# Patient Record
Sex: Male | Born: 1970 | Race: Asian | Marital: Married | Smoking: Never smoker
Health system: Southern US, Community
[De-identification: ages and names within clinical notes are randomized; demographics above are authoritative.]

## PROBLEM LIST (undated history)

## (undated) DIAGNOSIS — E785 Hyperlipidemia, unspecified: Secondary | ICD-10-CM

## (undated) DIAGNOSIS — I498 Other specified cardiac arrhythmias: Secondary | ICD-10-CM

## (undated) DIAGNOSIS — I471 Supraventricular tachycardia, unspecified: Secondary | ICD-10-CM

## (undated) HISTORY — DX: Other specified cardiac arrhythmias: I49.8

---

## 2006-08-25 HISTORY — PX: CARDIAC DEFIBRILLATOR PLACEMENT: SHX171

## 2012-01-24 ENCOUNTER — Institutional Professional Consult (permissible substitution): Payer: Self-pay | Admitting: Cardiovascular Disease

## 2012-01-26 ENCOUNTER — Encounter: Payer: Self-pay | Admitting: Internal Medicine

## 2012-01-26 ENCOUNTER — Ambulatory Visit (INDEPENDENT_AMBULATORY_CARE_PROVIDER_SITE_OTHER): Payer: Managed Care, Other (non HMO) | Admitting: Internal Medicine

## 2012-01-26 VITALS — BP 125/84 | HR 65 | Ht 68.0 in | Wt 185.0 lb

## 2012-01-26 DIAGNOSIS — Q248 Other specified congenital malformations of heart: Secondary | ICD-10-CM

## 2012-01-26 DIAGNOSIS — I498 Other specified cardiac arrhythmias: Secondary | ICD-10-CM | POA: Insufficient documentation

## 2012-01-26 NOTE — Assessment & Plan Note (Signed)
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 Carelink very 3 months Return in 1 year

## 2012-01-26 NOTE — Progress Notes (Signed)
    Matthew Chan is a 41 y.o. male with a h/o brugada syndrome s/p (MDT) ICD implant in [place] in 2008  who presents today to establish care in the Electrophysiology device clinic.   He reports having several episodes of syncope in 2008 in the setting of an abnormal ekg.  Per patient, he was felt to have brugada syndrome and therefore had an ICD implanted.   The patient reports doing very well since having his device implanted and remains very active.  He denies any further syncope or ICD therapies delivered.  He travels frequently and has moved to [place] with his job.   Today, he  denies symptoms of palpitations, chest pain, shortness of breath, orthopnea, PND, lower extremity edema, dizziness, presyncope, syncope, or neurologic sequela.  The patientis tolerating medications without difficulties and is otherwise without complaint today.   Past Medical History  Diagnosis Date  . Brugada syndrome    Past Surgical History  Procedure Date  . Cardiac defibrillator placement 08-25-2006    medtronic, Maximo VR Defibrillator implanted by Dr Ernestine Conrad in [place]    History   Social History  . Marital Status: Married    Spouse Name: N/A    Number of Children: N/A  . Years of Education: N/A   Occupational History  . Not on file.   Social History Main Topics  . Smoking status: Never Smoker   . Smokeless tobacco: Not on file  . Alcohol Use: Yes     Comment: occasionally   . Drug Use: No  . Sexually Active: Not on file   Other Topics Concern  . Not on file   Social History Narrative   Arts administrator.  Lives in [place], moved here from [place]    Family History  Problem Relation Age of Onset  . Hypertension    . Diabetes    Denies FH of arrhythmias, sudden death, or Brugada  No Known Allergies  No current outpatient prescriptions on file.    ROS- all systems are reviewed and negative except as per HPI  Physical Exam: Filed Vitals:   01/26/12 1437  BP: 125/84  Pulse: 65  Height: 5\' 8"  (1.727 m)  Weight: 185 lb (83.915 kg)    GEN- The patient is well appearing, alert and oriented x 3 today.   Head- normocephalic, atraumatic Eyes-  Sclera clear, conjunctiva pink Ears- hearing intact Oropharynx- clear Neck- supple, no JVP Lymph- no cervical lymphadenopathy Lungs- Clear to ausculation bilaterally, normal work of breathing Chest- ICD pocket is well healed Heart- Regular rate and rhythm, no murmurs, rubs or gallops, PMI not laterally displaced GI- soft, NT, ND, + BS Extremities- no clubbing, cyanosis, or edema MS- no significant deformity or atrophy Skin- no rash or lesion Psych- euthymic mood, full affect Neuro- strength and sensation are intact  ICD interrogation- reviewed in detail today,  See PACEART report ekg today reveals sinus rhythm with diffuse repolarization abnormality, there is St elevation in V2 although this is not a brugada I pattern ekg today  Assessment and Plan:

## 2012-01-26 NOTE — Patient Instructions (Signed)
Your physician wants you to follow-up in: 12 months with Dr Jacquiline Doe will receive a reminder letter in the mail two months in advance. If you don't receive a letter, please call our office to schedule the follow-up appointment.   Remote monitoring is used to monitor your Pacemaker of ICD from home. This monitoring reduces the number of office visits required to check your device to one time per year. It allows [place] to keep an eye on the functioning of your device to ensure it is working properly. You are scheduled for a device check from home on 05/01/12. You may send your transmission at any time that day. If you have a wireless device, the transmission will be sent automatically. After your physician reviews your transmission, you will receive a postcard with your next transmission date.   Genetic testing

## 2012-05-01 ENCOUNTER — Encounter: Payer: Managed Care, Other (non HMO) | Admitting: *Deleted

## 2012-05-08 ENCOUNTER — Encounter: Payer: Self-pay | Admitting: *Deleted

## 2012-05-23 ENCOUNTER — Telehealth: Payer: Self-pay | Admitting: Internal Medicine

## 2012-05-23 NOTE — Telephone Encounter (Signed)
New problem    C/O left arm & shoulder pain.  As well chest pain.

## 2012-05-23 NOTE — Telephone Encounter (Signed)
Saw the MD at work and feels like it is related to his neck and he is going to take it today and call me back and let me know

## 2012-05-23 NOTE — Telephone Encounter (Addendum)
Last Sun morning starting hurting.  This morning around 4:30am sharpe pain, and a "pinch" in left shoulder and hand. Pain is continuous since 4:30am.  Denies SOB, dizziness.  Only continuous pain, He has tried an antiinflammatory cream and this has not helped.  He has not however tried any Ibuprofen or Tylenol to see if this will relieve any of his symptoms. He is going to take pain medications to see if he gets any relief.  I offered him an appointment to see PA at 3:20 today but he would like to see MD.  I have added him on tomorrow at 10:15 to see Dr Johney Frame.  Also explained to him he could go to the ER and be evaluated if the pain continues.

## 2012-05-24 ENCOUNTER — Ambulatory Visit: Payer: Self-pay | Admitting: Internal Medicine

## 2013-01-26 ENCOUNTER — Encounter: Payer: Self-pay | Admitting: Internal Medicine

## 2013-01-26 ENCOUNTER — Ambulatory Visit (INDEPENDENT_AMBULATORY_CARE_PROVIDER_SITE_OTHER): Payer: Self-pay | Admitting: Internal Medicine

## 2013-01-26 VITALS — BP 108/70 | HR 71 | Ht 68.0 in | Wt 185.4 lb

## 2013-01-26 DIAGNOSIS — Q248 Other specified congenital malformations of heart: Secondary | ICD-10-CM

## 2013-01-26 DIAGNOSIS — I498 Other specified cardiac arrhythmias: Secondary | ICD-10-CM

## 2013-01-26 LAB — MDC_IDC_ENUM_SESS_TYPE_INCLINIC
Battery Voltage: 2.86 V
Brady Statistic RV Percent Paced: 0 %
HighPow Impedance: 45 Ohm
HighPow Impedance: 48 Ohm
HighPow Impedance: 48 Ohm
HighPow Impedance: 48 Ohm
HighPow Impedance: 48 Ohm
HighPow Impedance: 49 Ohm
HighPow Impedance: 49 Ohm
HighPow Impedance: 49 Ohm
HighPow Impedance: 51 Ohm
HighPow Impedance: 52 Ohm
HighPow Impedance: 60 Ohm
HighPow Impedance: 62 Ohm
HighPow Impedance: 63 Ohm
HighPow Impedance: 63 Ohm
HighPow Impedance: 66 Ohm
HighPow Impedance: 68 Ohm
HighPow Impedance: 68 Ohm
Lead Channel Impedance Value: 408 Ohm
Lead Channel Impedance Value: 408 Ohm
Lead Channel Impedance Value: 416 Ohm
Lead Channel Impedance Value: 416 Ohm
Lead Channel Impedance Value: 424 Ohm
Lead Channel Impedance Value: 424 Ohm
Lead Channel Impedance Value: 432 Ohm
Lead Channel Impedance Value: 432 Ohm
Lead Channel Pacing Threshold Pulse Width: 0.3 ms
Lead Channel Sensing Intrinsic Amplitude: 13.3 mV
Lead Channel Sensing Intrinsic Amplitude: 13.5 mV
Lead Channel Sensing Intrinsic Amplitude: 13.9 mV
Lead Channel Sensing Intrinsic Amplitude: 14 mV
Lead Channel Sensing Intrinsic Amplitude: 14.2 mV
Lead Channel Sensing Intrinsic Amplitude: 14.7 mV
Lead Channel Setting Pacing Amplitude: 2.5 V
Lead Channel Setting Pacing Pulse Width: 0.6 ms
Lead Channel Setting Sensing Sensitivity: 0.3 mV
Zone Setting Detection Interval: 290 ms
Zone Setting Detection Interval: 350 ms

## 2013-01-26 NOTE — Patient Instructions (Signed)
Your physician recommends that you continue on your current medications as directed. Please refer to the Current Medication list given to you today.  Remote monitoring is used to monitor your Pacemaker of ICD from home. This monitoring reduces the number of office visits required to check your device to one time per year. It allows us to keep an eye on the functioning of your device to ensure it is working properly. You are scheduled for a device check from home on 04/30/2013. You may send your transmission at any time that day. If you have a wireless device, the transmission will be sent automatically. After your physician reviews your transmission, you will receive a postcard with your next transmission date.  Your physician wants you to follow-up in: one year with Dr. Allred.  You will receive a reminder letter in the mail two months in advance. If you don't receive a letter, please call our office to schedule the follow-up appointment.     

## 2013-01-26 NOTE — Progress Notes (Signed)
   Matthew Chan is a 42 y.o. male who presents today for routine electrophysiology followup.  Since last being seen in our clinic, the patient reports doing very well.  Today, he denies symptoms of palpitations, chest pain, shortness of breath,  lower extremity edema, dizziness, presyncope, syncope, or ICD shocks.  The patient is otherwise without complaint today.   Past Medical History  Diagnosis Date  . Brugada syndrome   . ICD (implantable cardioverter-defibrillator) in place 08/22/2006    medtronic, Maximo VR Defibrillator implanted by Dr Ernestine Conrad in [place]   Past Surgical History  Procedure Laterality Date  . Cardiac defibrillator placement  08-25-2006    medtronic, Maximo VR Defibrillator implanted by Dr Ernestine Conrad in [place]    No current outpatient prescriptions on file.   No current facility-administered medications for this visit.    Physical Exam: Filed Vitals:   01/26/13 1554  BP: 108/70  Pulse: 71  Height: 5\' 8"  (1.727 m)  Weight: 185 lb 6.4 oz (84.097 kg)    GEN- The patient is well appearing, alert and oriented x 3 today.   Head- normocephalic, atraumatic Eyes-  Sclera clear, conjunctiva pink Ears- hearing intact Oropharynx- clear Lungs- Clear to ausculation bilaterally, normal work of breathing Chest- ICD pocket is well healed Heart- Regular rate and rhythm, no murmurs, rubs or gallops, PMI not laterally displaced GI- soft, NT, ND, + BS Extremities- no clubbing, cyanosis, or edema  ICD interrogation- reviewed in detail today,  See PACEART report  Assessment and Plan:   1.  Brugada syndrome Genetic test results reviewed with the patient.  His test was negative for a genetic mutation Normal ICD function See Arita Miss Art report No changes today  Carelink Return in 1 year

## 2013-04-30 ENCOUNTER — Encounter: Payer: Self-pay | Admitting: *Deleted

## 2013-05-18 ENCOUNTER — Encounter: Payer: Self-pay | Admitting: *Deleted

## 2013-08-01 ENCOUNTER — Encounter: Payer: Self-pay | Admitting: Cardiology

## 2014-02-01 ENCOUNTER — Encounter: Payer: Self-pay | Admitting: Internal Medicine

## 2014-02-01 ENCOUNTER — Ambulatory Visit (INDEPENDENT_AMBULATORY_CARE_PROVIDER_SITE_OTHER): Payer: Managed Care, Other (non HMO) | Admitting: Internal Medicine

## 2014-02-01 VITALS — BP 114/68 | HR 84 | Ht 68.0 in | Wt 190.8 lb

## 2014-02-01 DIAGNOSIS — E663 Overweight: Secondary | ICD-10-CM

## 2014-02-01 DIAGNOSIS — Q248 Other specified congenital malformations of heart: Secondary | ICD-10-CM

## 2014-02-01 DIAGNOSIS — I498 Other specified cardiac arrhythmias: Secondary | ICD-10-CM

## 2014-02-01 NOTE — Patient Instructions (Signed)
Your physician wants you to follow-up in: 12 months with Dr. Jacquiline DoeAllred You will receive a reminder letter in the mail two months in advance. If you don't receive a letter, please call our office to schedule the follow-up appointment.    Remote monitoring is used to monitor your Pacemaker of ICD from home. This monitoring reduces the number of office visits required to check your device to one time per year. It allows us to keep an eye on the functioning of your device to ensure it is working properly. You are scheduled for a device check from home on 05/06/14. You may send your transmission at any time that day. If you have a wireless device, the transmission will be sent automatically. After your physician reviews your transmission, you will receive a postcard with your next transmission date.   Your physician recommends that you return for lab work week after Christmas lipid

## 2014-02-01 NOTE — Progress Notes (Signed)
PCP: none  Matthew Chan is a 43 y.o. male who presents today for routine electrophysiology followup.  Since last being seen in our clinic, the patient reports doing very well.  Today, he denies symptoms of palpitations, chest pain, shortness of breath,  lower extremity edema, dizziness, presyncope, syncope, or ICD shocks.  The patient is otherwise without complaint today.   Past Medical History  Diagnosis Date  . Brugada syndrome   . ICD (implantable cardioverter-defibrillator) in place 08/22/2006    medtronic, Maximo VR Defibrillator implanted by Dr Matthew Conrad[place]igsberg [place]   Past Surgical History  Procedure Laterality Date  . Cardiac defibrillator placement  08-25-2006    medtronic, Maximo VR Defibrillator implanted by Dr [place] ConradKenigsberg in [place]    No current outpatient prescriptions on file.   No current facility-administered medications for this visit.    Physical Exam: Filed Vitals:   02/01/14 1443  BP: 114/68  Pulse: 84  Height: 5\' 8"  (1.727 m)  Weight: 190 lb 12.8 oz (86.546 kg)    GEN- The patient is well appearing, alert and oriented x 3 today.   Head- normocephalic, atraumatic Eyes-  Sclera clear, conjunctiva pink Ears- hearing intact Oropharynx- clear Lungs- Clear to ausculation bilaterally, normal work of breathing Chest- ICD pocket is well healed Heart- Regular rate and rhythm, no murmurs, rubs or gallops, PMI not laterally displaced GI- soft, NT, ND, + BS Extremities- no clubbing, cyanosis, or edema  ICD interrogation- reviewed in detail today,  See PACEART report  Assessment and Plan:   1.  Brugada syndrome Genetic test results reviewed with the patient last visit.  His test was negative for a genetic mutation Normal ICD function See [name] report No changes today He is approaching ERI battery status carelink every 3 months until voltage 2.64 and then monthly  2. Health maintenance/ overweight Body mass index is 29.02 kg/(m^2). Exercise and  lifestyle modification encouraged Check fasting lipids   Carelink Return in 1 year

## 2014-02-06 LAB — MDC_IDC_ENUM_SESS_TYPE_INCLINIC
Battery Voltage: 2.66 V
Brady Statistic RV Percent Paced: 0 %
HIGH POWER IMPEDANCE MEASURED VALUE: 47 Ohm
HIGH POWER IMPEDANCE MEASURED VALUE: 48 Ohm
HIGH POWER IMPEDANCE MEASURED VALUE: 48 Ohm
HIGH POWER IMPEDANCE MEASURED VALUE: 48 Ohm
HIGH POWER IMPEDANCE MEASURED VALUE: 48 Ohm
HIGH POWER IMPEDANCE MEASURED VALUE: 49 Ohm
HIGH POWER IMPEDANCE MEASURED VALUE: 49 Ohm
HIGH POWER IMPEDANCE MEASURED VALUE: 50 Ohm
HIGH POWER IMPEDANCE MEASURED VALUE: 51 Ohm
HIGH POWER IMPEDANCE MEASURED VALUE: 63 Ohm
HIGH POWER IMPEDANCE MEASURED VALUE: 63 Ohm
HIGH POWER IMPEDANCE MEASURED VALUE: 63 Ohm
HIGH POWER IMPEDANCE MEASURED VALUE: 64 Ohm
HIGH POWER IMPEDANCE MEASURED VALUE: 64 Ohm
HIGH POWER IMPEDANCE MEASURED VALUE: 65 Ohm
HIGH POWER IMPEDANCE MEASURED VALUE: 65 Ohm
HIGH POWER IMPEDANCE MEASURED VALUE: 66 Ohm
HIGH POWER IMPEDANCE MEASURED VALUE: 67 Ohm
HighPow Impedance: 48 Ohm
HighPow Impedance: 48 Ohm
HighPow Impedance: 48 Ohm
HighPow Impedance: 49 Ohm
HighPow Impedance: 51 Ohm
HighPow Impedance: 51 Ohm
HighPow Impedance: 52 Ohm
HighPow Impedance: 62 Ohm
HighPow Impedance: 64 Ohm
HighPow Impedance: 64 Ohm
HighPow Impedance: 65 Ohm
HighPow Impedance: 66 Ohm
HighPow Impedance: 67 Ohm
Lead Channel Impedance Value: 408 Ohm
Lead Channel Impedance Value: 408 Ohm
Lead Channel Impedance Value: 416 Ohm
Lead Channel Impedance Value: 416 Ohm
Lead Channel Impedance Value: 424 Ohm
Lead Channel Impedance Value: 424 Ohm
Lead Channel Impedance Value: 424 Ohm
Lead Channel Pacing Threshold Amplitude: 1 V
Lead Channel Sensing Intrinsic Amplitude: 12.5 mV
Lead Channel Sensing Intrinsic Amplitude: 12.7 mV
Lead Channel Sensing Intrinsic Amplitude: 14.1 mV
Lead Channel Sensing Intrinsic Amplitude: 14.2 mV
Lead Channel Sensing Intrinsic Amplitude: 14.8 mV
Lead Channel Setting Pacing Amplitude: 2.5 V
Lead Channel Setting Pacing Pulse Width: 0.6 ms
Lead Channel Setting Sensing Sensitivity: 0.3 mV
MDC IDC MSMT LEADCHNL RV IMPEDANCE VALUE: 400 Ohm
MDC IDC MSMT LEADCHNL RV IMPEDANCE VALUE: 408 Ohm
MDC IDC MSMT LEADCHNL RV IMPEDANCE VALUE: 408 Ohm
MDC IDC MSMT LEADCHNL RV IMPEDANCE VALUE: 416 Ohm
MDC IDC MSMT LEADCHNL RV IMPEDANCE VALUE: 416 Ohm
MDC IDC MSMT LEADCHNL RV IMPEDANCE VALUE: 416 Ohm
MDC IDC MSMT LEADCHNL RV IMPEDANCE VALUE: 416 Ohm
MDC IDC MSMT LEADCHNL RV IMPEDANCE VALUE: 432 Ohm
MDC IDC MSMT LEADCHNL RV PACING THRESHOLD PULSEWIDTH: 0.4 ms
MDC IDC MSMT LEADCHNL RV SENSING INTR AMPL: 13.1 mV
MDC IDC MSMT LEADCHNL RV SENSING INTR AMPL: 13.3 mV
MDC IDC MSMT LEADCHNL RV SENSING INTR AMPL: 13.4 mV
MDC IDC MSMT LEADCHNL RV SENSING INTR AMPL: 13.6 mV
MDC IDC MSMT LEADCHNL RV SENSING INTR AMPL: 13.6 mV
MDC IDC MSMT LEADCHNL RV SENSING INTR AMPL: 13.7 mV
MDC IDC MSMT LEADCHNL RV SENSING INTR AMPL: 14.1 mV
MDC IDC MSMT LEADCHNL RV SENSING INTR AMPL: 14.5 mV
MDC IDC MSMT LEADCHNL RV SENSING INTR AMPL: 14.5 mV
MDC IDC MSMT LEADCHNL RV SENSING INTR AMPL: 14.8 mV
MDC IDC SESS DTM: 20151204195449
MDC IDC SET ZONE DETECTION INTERVAL: 280 ms
MDC IDC SET ZONE DETECTION INTERVAL: 290 ms
MDC IDC SET ZONE DETECTION INTERVAL: 350 ms

## 2014-02-27 ENCOUNTER — Other Ambulatory Visit (INDEPENDENT_AMBULATORY_CARE_PROVIDER_SITE_OTHER): Payer: Managed Care, Other (non HMO) | Admitting: *Deleted

## 2014-02-27 DIAGNOSIS — E789 Disorder of lipoprotein metabolism, unspecified: Secondary | ICD-10-CM

## 2014-02-27 LAB — LIPID PANEL
CHOL/HDL RATIO: 8
CHOLESTEROL: 288 mg/dL — AB (ref 0–200)
HDL: 38.3 mg/dL — ABNORMAL LOW (ref >39.00)
NonHDL: 249.7
Triglycerides: 329 mg/dL — ABNORMAL HIGH (ref 0.0–149.0)
VLDL: 65.8 mg/dL — AB (ref 0.0–40.0)

## 2014-02-27 LAB — LDL CHOLESTEROL, DIRECT: Direct LDL: 201.7 mg/dL

## 2014-03-07 ENCOUNTER — Other Ambulatory Visit: Payer: Self-pay | Admitting: Pharmacist

## 2014-03-07 DIAGNOSIS — E785 Hyperlipidemia, unspecified: Secondary | ICD-10-CM | POA: Insufficient documentation

## 2014-03-07 MED ORDER — ATORVASTATIN CALCIUM 40 MG PO TABS: 40.0000 mg | ORAL_TABLET | Freq: Every day | ORAL | Status: DC

## 2014-05-02 ENCOUNTER — Encounter: Payer: Managed Care, Other (non HMO) | Admitting: *Deleted

## 2014-05-02 ENCOUNTER — Telehealth: Payer: Self-pay | Admitting: Cardiology

## 2014-05-02 NOTE — Telephone Encounter (Signed)
Spoke with pt and reminded pt of remote transmission that is due today. Pt verbalized understanding.   

## 2014-05-03 ENCOUNTER — Encounter: Payer: Self-pay | Admitting: Cardiology

## 2014-06-07 ENCOUNTER — Other Ambulatory Visit (INDEPENDENT_AMBULATORY_CARE_PROVIDER_SITE_OTHER): Payer: Managed Care, Other (non HMO) | Admitting: *Deleted

## 2014-06-07 DIAGNOSIS — E785 Hyperlipidemia, unspecified: Secondary | ICD-10-CM

## 2014-06-07 LAB — LIPID PANEL
Cholesterol: 246 mg/dL — ABNORMAL HIGH (ref 0–200)
HDL: 40.2 mg/dL (ref >39.00)
NONHDL: 205.8
Total CHOL/HDL Ratio: 6
Triglycerides: 230 mg/dL — ABNORMAL HIGH (ref 0.0–149.0)
VLDL: 46 mg/dL — ABNORMAL HIGH (ref 0.0–40.0)

## 2014-06-07 LAB — HEPATIC FUNCTION PANEL
ALT: 19 U/L (ref 0–53)
AST: 20 U/L (ref 0–37)
Albumin: 4.6 g/dL (ref 3.5–5.2)
Alkaline Phosphatase: 57 U/L (ref 39–117)
BILIRUBIN DIRECT: 0.1 mg/dL (ref 0.0–0.3)
BILIRUBIN TOTAL: 0.4 mg/dL (ref 0.2–1.2)
TOTAL PROTEIN: 7.7 g/dL (ref 6.0–8.3)

## 2014-06-07 LAB — LDL CHOLESTEROL, DIRECT: Direct LDL: 185 mg/dL

## 2014-06-18 ENCOUNTER — Telehealth: Payer: Self-pay | Admitting: Internal Medicine

## 2014-06-18 NOTE — Telephone Encounter (Signed)
New Message    Patients wife is calling because they are wondering what the test/lab results are from last week . Please give patient a call .

## 2014-06-18 NOTE — Telephone Encounter (Signed)
Pt's wife called for pt's  lab results. Results given.  Dr. Johney FrameAllred order Simvastatin 20 mg daily on the lab results, and for pt to be seen in the lipid clinic. On the medication list pt is listed as taking Lipitor 40 mg daily. Pt's wife does not know if pt is taking Lipitor or not; so wife will ask the pt tomorrow. Wife would like for husband to be call to clarify this information. The scheduler is aware to call pt for the lipid clinic appointment.

## 2014-06-21 NOTE — Telephone Encounter (Signed)
Is everything taken care of with this?

## 2014-06-21 NOTE — Telephone Encounter (Signed)
He has a 06/25/14 lipid clinic appointment

## 2014-06-25 ENCOUNTER — Ambulatory Visit (INDEPENDENT_AMBULATORY_CARE_PROVIDER_SITE_OTHER): Payer: Managed Care, Other (non HMO) | Admitting: Pharmacist Clinician (PhC)/ Clinical Pharmacy Specialist

## 2014-06-25 VITALS — Ht 68.0 in | Wt 191.0 lb

## 2014-06-25 DIAGNOSIS — E785 Hyperlipidemia, unspecified: Secondary | ICD-10-CM

## 2014-06-25 NOTE — Patient Instructions (Signed)
It was nice to meet you today.  Start taking fish oil 1,000mg  capsules - take 2 capsules twice daily  Increase the amount of fiber in your diet - almonds, oatmeal, salads, nuts;  Avoid eating sweets, white breads, pastas, potatoes  Increase your exercise.  Ideal is to have 30 minutes at least 4-5 days each week.    Repeat cholesterol labs in 3 months, then see us a few days later

## 2014-06-26 ENCOUNTER — Encounter: Payer: Self-pay | Admitting: Pharmacist Clinician (PhC)/ Clinical Pharmacy Specialist

## 2014-06-26 NOTE — Assessment & Plan Note (Addendum)
Pt with mixed hyperlipidemia.  He would like to focus on dietary improvements and exercise, even though he realized this will probably not be enough to get his LDL <130.  He is agreeable to start with fish oil, 2 gm bid for his triglycerides.  Will have him work on the lifestyle modifications for 3 months, then have him repeat blood work and see us several days later.

## 2014-06-26 NOTE — Progress Notes (Signed)
06/26/2014 Matthew SpruceSridhar Rehman 02-06-1971 [id number]   HPI:  Matthew Chan is a 44 y.o. male patient of Dr Johney FrameAllred, who presents today for a lipid clinic evaluation.  He has not taken any medications for his cholesterol in the past.  He was prescribed simvastatin by Dr. Johney FrameAllred, but states that there was never a prescription at the pharmacy.   Meds: none  JY:[id number]:hyperlipidemia    Family history: no known ASCVD in parents or sisters  Diet: chicken, fish, some lamb, plenty of vegetables, states wife is worried about his cholesterol, started making him eat more salads, nuts. Very little "white foods"  Exercise: has recently started doing some weight lifting.  Has eliptical and treadmill at home, hasn't been using either    Labs:  Results for Matthew Chan, Matthew Chan (MRN [id number]) as of 06/26/2014 19:42  Ref. Range 02/27/2014 10:02 06/07/2014 10:07  Cholesterol Latest Ref Range: 0-200 mg/dL 841288 (H) 324246 (H)  Triglycerides Latest Ref Range: 0.0-149.0 mg/dL 401.0329.0 (H) 272.5230.0 (H)  HDL Cholesterol Latest Ref Range: >39.00 mg/dL 36.6438.30 (L) 40.3440.20  Direct LDL Latest Units: mg/dL 742.5201.7 956.3185.0  VLDL Latest Ref Range: 0.0-40.0 mg/dL 87.565.8 (H) 64.346.0 (H)  Total CHOL/HDL Ratio Unknown 8 6      No current outpatient prescriptions on file.   No current facility-administered medications for this visit.    No Known Allergies  Past Medical History  Diagnosis Date  . Brugada syndrome   . ICD (implantable cardioverter-defibrillator) in place 08/22/2006    medtronic, Maximo VR Defibrillator implanted by Dr [name] in [place]    Height 5\' 8"  (1.727 m), weight 191 lb (86.637 kg).   Phillips HayKristin Alvstad PharmD CPP [facility]

## 2014-09-23 ENCOUNTER — Other Ambulatory Visit: Payer: Managed Care, Other (non HMO)

## 2014-10-01 ENCOUNTER — Ambulatory Visit: Payer: Managed Care, Other (non HMO) | Admitting: Pharmacist

## 2014-10-08 ENCOUNTER — Encounter (HOSPITAL_BASED_OUTPATIENT_CLINIC_OR_DEPARTMENT_OTHER): Payer: Self-pay

## 2014-10-08 ENCOUNTER — Emergency Department (HOSPITAL_BASED_OUTPATIENT_CLINIC_OR_DEPARTMENT_OTHER)
Admission: EM | Admit: 2014-10-08 | Discharge: 2014-10-08 | Disposition: A | Discharge status: Home / Self Care | Payer: Managed Care, Other (non HMO) | Attending: Emergency Medicine | Admitting: Emergency Medicine

## 2014-10-08 ENCOUNTER — Ambulatory Visit: Payer: Managed Care, Other (non HMO) | Admitting: Pharmacist

## 2014-10-08 ENCOUNTER — Emergency Department (HOSPITAL_BASED_OUTPATIENT_CLINIC_OR_DEPARTMENT_OTHER): Payer: Managed Care, Other (non HMO)

## 2014-10-08 DIAGNOSIS — S62629A Displaced fracture of medial phalanx of unspecified finger, initial encounter for closed fracture: Secondary | ICD-10-CM

## 2014-10-08 DIAGNOSIS — Y998 Other external cause status: Secondary | ICD-10-CM | POA: Insufficient documentation

## 2014-10-08 DIAGNOSIS — S6991XA Unspecified injury of right wrist, hand and finger(s), initial encounter: Secondary | ICD-10-CM | POA: Diagnosis present

## 2014-10-08 DIAGNOSIS — Y92410 Unspecified street and highway as the place of occurrence of the external cause: Secondary | ICD-10-CM | POA: Insufficient documentation

## 2014-10-08 DIAGNOSIS — S20212A Contusion of left front wall of thorax, initial encounter: Secondary | ICD-10-CM | POA: Insufficient documentation

## 2014-10-08 DIAGNOSIS — S4992XA Unspecified injury of left shoulder and upper arm, initial encounter: Secondary | ICD-10-CM | POA: Diagnosis not present

## 2014-10-08 DIAGNOSIS — Y9389 Activity, other specified: Secondary | ICD-10-CM | POA: Diagnosis not present

## 2014-10-08 DIAGNOSIS — Z95810 Presence of automatic (implantable) cardiac defibrillator: Secondary | ICD-10-CM | POA: Insufficient documentation

## 2014-10-08 DIAGNOSIS — S62614A Displaced fracture of proximal phalanx of right ring finger, initial encounter for closed fracture: Secondary | ICD-10-CM | POA: Diagnosis not present

## 2014-10-08 DIAGNOSIS — R52 Pain, unspecified: Secondary | ICD-10-CM

## 2014-10-08 MED ORDER — ACETAMINOPHEN 325 MG PO TABS
975.0000 mg | ORAL_TABLET | Freq: Once | ORAL | Status: AC
  Administered 2014-10-08: 975 mg via ORAL
  Filled 2014-10-08: qty 3

## 2014-10-08 MED ORDER — HYDROCODONE-ACETAMINOPHEN 5-325 MG PO TABS: ORAL_TABLET | ORAL | Status: DC

## 2014-10-08 MED ORDER — IBUPROFEN 400 MG PO TABS
400.0000 mg | ORAL_TABLET | Freq: Once | ORAL | Status: AC
  Administered 2014-10-08: 400 mg via ORAL
  Filled 2014-10-08: qty 1

## 2014-10-08 MED ORDER — TETANUS-DIPHTH-ACELL PERTUSSIS 5-2.5-18.5 LF-MCG/0.5 IM SUSP
0.5000 mL | Freq: Once | INTRAMUSCULAR | Status: AC
  Administered 2014-10-08: 0.5 mL via INTRAMUSCULAR
  Filled 2014-10-08: qty 0.5

## 2014-10-08 MED ORDER — BACITRACIN ZINC 500 UNIT/GM EX OINT: TOPICAL_OINTMENT | Freq: Once | CUTANEOUS | Status: DC

## 2014-10-08 NOTE — Discharge Instructions (Signed)
Wash the affected area with soap and water and apply a thin layer of topical antibiotic ointment. Do this every 12 hours.  Do not use rubbing alcohol or hydrogen peroxide.                       Look for signs of infection: if you see redness, if the area becomes warm, if pain increases sharply, there is discharge (pus), if red streaks appear or you develop fever or vomiting, RETURN immediately to the Emergency Department  for a recheck.   Take vicodin for breakthrough pain, do not drink alcohol, drive, care for children or do other critical tasks while taking vicodin.It is very important that you take deep breaths to prevent lung collapse and infection. Take 10 deep breaths every hour to prevent lung collapse. If you develop cough, fever or shortness of breath return immediately to the emergency room.  Please follow with your primary care doctor in the next 2 days for a check-up. They must obtain records for further management.   Do not hesitate to return to the Emergency Department for any new, worsening or concerning symptoms.
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
## 2015-01-27 ENCOUNTER — Telehealth: Payer: Self-pay | Admitting: Internal Medicine

## 2015-01-27 NOTE — Telephone Encounter (Signed)
Spoke to patient regarding audible alert. Patient states that the alarm only goes off once in the am. Patient's battery voltage was 2.66V one year ago. I asked patient to send a remote transmission. Patient voiced understanding. Verbal instructions given on how to send the transmission.

## 2015-01-27 NOTE — Telephone Encounter (Signed)
New Message   1. Has your device fired? no  2. Is you device beeping? Yes- for last 4 days   3. Are you experiencing draining or swelling at device site? no  4. Are you calling to see if we received your device transmission? no  5. Have you passed out? no  Pt saying that his device is beeping for the last 4 days. Please call back and discuss.

## 2015-01-27 NOTE — Telephone Encounter (Signed)
LMTCB/SSS 

## 2015-01-28 NOTE — Telephone Encounter (Signed)
LMTCB//sss  ERI reached on 01/24/15.

## 2015-01-28 NOTE — Telephone Encounter (Signed)
Called patient since remote has not been received. Patient states that he has not tried to send it. Patient states that he will attempt to do it now.

## 2015-01-28 NOTE — Telephone Encounter (Signed)
Informed patient that his device has reached ERI. I explained to him that the audible alert will continue to sound every morning at the same time until his device is checked in the office. I offered him a sooner appt with the Device Clinic to have the audible alert d/c'd but pt was fine with waiting until 12/7 appt. Patient understands that gen change will be scheduled at 12/7 appt.

## 2015-02-05 ENCOUNTER — Encounter: Payer: Self-pay | Admitting: Internal Medicine

## 2015-02-05 ENCOUNTER — Ambulatory Visit (INDEPENDENT_AMBULATORY_CARE_PROVIDER_SITE_OTHER): Payer: Managed Care, Other (non HMO) | Admitting: Internal Medicine

## 2015-02-05 VITALS — BP 132/68 | HR 58 | Ht 68.0 in | Wt 190.0 lb

## 2015-02-05 DIAGNOSIS — Z4502 Encounter for adjustment and management of automatic implantable cardiac defibrillator: Secondary | ICD-10-CM

## 2015-02-05 DIAGNOSIS — E785 Hyperlipidemia, unspecified: Secondary | ICD-10-CM | POA: Diagnosis not present

## 2015-02-05 DIAGNOSIS — I498 Other specified cardiac arrhythmias: Secondary | ICD-10-CM

## 2015-02-05 DIAGNOSIS — E663 Overweight: Secondary | ICD-10-CM | POA: Diagnosis not present

## 2015-02-05 DIAGNOSIS — Q248 Other specified congenital malformations of heart: Secondary | ICD-10-CM | POA: Diagnosis not present

## 2015-02-05 NOTE — Patient Instructions (Addendum)
Medication Instructions:  Your physician recommends that you continue on your current medications as directed. Please refer to the Current Medication list given to you today.   Labwork: Your physician recommends that you return for lab work on same day as echo---fasting lipids      Testing/Procedures: Your physician has requested that you have an echocardiogram. Echocardiography is a painless test that uses sound waves to create images of your heart. It provides your doctor with information about the size and shape of your heart and how well your heart's chambers and valves are working. This procedure takes approximately one hour. There are no restrictions for this procedure.     Follow-Up:  Dr Johney FrameAllred will call you after the echo to discuss plan.       Any Other Special Instructions Will Be Listed Below (If Applicable).     If you need a refill on your cardiac medications before your next appointment, please call your pharmacy.

## 2015-02-06 ENCOUNTER — Other Ambulatory Visit (INDEPENDENT_AMBULATORY_CARE_PROVIDER_SITE_OTHER): Payer: Managed Care, Other (non HMO)

## 2015-02-06 ENCOUNTER — Other Ambulatory Visit: Payer: Self-pay

## 2015-02-06 ENCOUNTER — Ambulatory Visit (HOSPITAL_COMMUNITY): Payer: Managed Care, Other (non HMO) | Attending: Cardiology

## 2015-02-06 DIAGNOSIS — E785 Hyperlipidemia, unspecified: Secondary | ICD-10-CM | POA: Diagnosis not present

## 2015-02-06 DIAGNOSIS — Q248 Other specified congenital malformations of heart: Secondary | ICD-10-CM | POA: Diagnosis not present

## 2015-02-06 DIAGNOSIS — I498 Other specified cardiac arrhythmias: Secondary | ICD-10-CM

## 2015-02-06 DIAGNOSIS — I340 Nonrheumatic mitral (valve) insufficiency: Secondary | ICD-10-CM | POA: Diagnosis not present

## 2015-02-06 DIAGNOSIS — I071 Rheumatic tricuspid insufficiency: Secondary | ICD-10-CM | POA: Insufficient documentation

## 2015-02-06 LAB — LIPID PANEL
CHOL/HDL RATIO: 6.8 ratio — AB (ref <=5.0)
Cholesterol: 257 mg/dL — ABNORMAL HIGH (ref 125–200)
HDL: 38 mg/dL — ABNORMAL LOW (ref >=40)
LDL Cholesterol: 180 mg/dL — ABNORMAL HIGH (ref <130)
Triglycerides: 195 mg/dL — ABNORMAL HIGH (ref <150)
VLDL: 39 mg/dL — ABNORMAL HIGH (ref <30)

## 2015-02-10 NOTE — Progress Notes (Signed)
PCP: none  Geoffery SpruceSridhar Spindle is a 44 y.o. male who presents today for routine electrophysiology followup.  Since last being seen in our clinic, the patient reports doing very well.  He remains active.  Today, he denies symptoms of palpitations, chest pain, shortness of breath,  lower extremity edema, dizziness, presyncope, syncope, or ICD shocks.  The patient is otherwise without complaint today.   Past Medical History  Diagnosis Date  . Brugada syndrome   . 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 Cardiac defibrillator placement  08-25-2006    medtronic, Maximo VR Defibrillator implanted by Dr [name] in [place]    Current Outpatient Prescriptions  Medication Sig Dispense Refill  . MULTIPLE VITAMIN PO Take 1 tablet by mouth once a week.     No current facility-administered medications for this visit.    Physical Exam: Filed Vitals:   02/05/15 1230  BP: 132/68  Pulse: 58  Height: 5\' 8"  (1.727 m)  Weight: 190 lb (86.183 kg)    GEN- The patient is well appearing, alert and oriented x 3 today.   Head- normocephalic, atraumatic Eyes-  Sclera clear, conjunctiva pink Ears- hearing intact Oropharynx- clear Lungs- Clear to ausculation bilaterally, normal work of breathing Chest- ICD pocket is well healed Heart- Regular rate and rhythm, no murmurs, rubs or gallops, PMI not laterally displaced GI- soft, NT, ND, + BS Extremities- no clubbing, cyanosis, or edema  ICD interrogation- reviewed in detail today,  See PACEART report ekg today reveals sinus rhythm 58 bpm, LVH with diffuse repolarization abnormality unchanged from 2013.  Assessment and Plan:   1.  Brugada syndrome Genetic test results reviewed with the patient last visit.  His test was negative for a genetic mutation Normal ICD function though he has reached ERI  battery status. See [name] report No changes today I had a long discussion with the patient today about ICD generator replacement.  He is clear that he does not wish to have his ICD generator replaced.  He questions the need as he has never received appropriate ICD therapy.  Though he was implanted with a presumed diagnosis of brugada syndrome with syncope, he has negative genetic testing for brugada.  I have explained that he could have a genetic mutation that was not testing with genetic screen.  He remains clear that he does not wish to have his ICD replaced at this time.  He would prefer to have the generator removed and the lead capped and placed in the pocket.  I will honor his wishes. Obtain an echo to evaluate for significant structural change prior to device removal.  2. Health maintenance/ overweight/ hyperlipidemia Body mass index is 28.9 kg/(m^2). Exercise and lifestyle modification encouraged Check fasting lipids  Today, I have spent 25  minutes with the patient [facility] and history of syncope.  More than 50% of the visit time today was spent on this issue.  Hillis RangeJames Craigory Toste MD, [facility] 02/10/2015 8:24 PM

## 2015-02-14 ENCOUNTER — Telehealth: Payer: Self-pay | Admitting: Internal Medicine

## 2015-02-14 NOTE — Telephone Encounter (Signed)
New  Message ° °Pt returned call  °

## 2015-02-14 NOTE — Telephone Encounter (Signed)
Melissa has left the patient a message to schedule an appointment

## 2015-02-14 NOTE — Telephone Encounter (Signed)
Needs appointment with Dr Ladona Ridgelaylor to discuss extraction

## 2015-02-17 NOTE — Telephone Encounter (Signed)
Appointment on 02/18/15 with Dr Ladona Ridgelaylor

## 2015-02-18 ENCOUNTER — Ambulatory Visit (INDEPENDENT_AMBULATORY_CARE_PROVIDER_SITE_OTHER): Payer: Managed Care, Other (non HMO) | Admitting: Pharmacist

## 2015-02-18 ENCOUNTER — Encounter: Payer: Self-pay | Admitting: Internal Medicine

## 2015-02-18 ENCOUNTER — Ambulatory Visit (INDEPENDENT_AMBULATORY_CARE_PROVIDER_SITE_OTHER): Payer: Managed Care, Other (non HMO) | Admitting: Internal Medicine

## 2015-02-18 VITALS — BP 124/72 | HR 81 | Ht 68.0 in | Wt 191.2 lb

## 2015-02-18 DIAGNOSIS — Z4502 Encounter for adjustment and management of automatic implantable cardiac defibrillator: Secondary | ICD-10-CM | POA: Insufficient documentation

## 2015-02-18 DIAGNOSIS — Q248 Other specified congenital malformations of heart: Secondary | ICD-10-CM

## 2015-02-18 DIAGNOSIS — E785 Hyperlipidemia, unspecified: Secondary | ICD-10-CM

## 2015-02-18 DIAGNOSIS — I498 Other specified cardiac arrhythmias: Secondary | ICD-10-CM

## 2015-02-18 NOTE — Assessment & Plan Note (Signed)
Despite his remote history of syncope and Brugada pattern on ECG, his genetic testing was normal.

## 2015-02-18 NOTE — Patient Instructions (Signed)
Medication Instructions:  Your physician recommends that you continue on your current medications as directed. Please refer to the Current Medication list given to you today.   Labwork: None ordered   Testing/Procedures: Device extraction with Dr Kathleene Hazelaylor---1/16, 1/20, 1/24, 1/30, 2/8,2/15, 2/20,  Follow-Up: Will schedule after extraction  Any Other Special Instructions Will Be Listed Below (If Applicable).     If you need a refill on your cardiac medications before your next appointment, please call your pharmacy.

## 2015-02-18 NOTE — Progress Notes (Signed)
Patient ID: Matthew Chan                 DOB: 1970-08-08, 44 yo                         MRN: [id number]     HPI: Matthew Chan is a 44 y.o. male patient referred to lipid clinic by Dr. Johney Chan. PMH is significant for Brugada syndrome and HLD. Patient has previously seen Matthew CruiseKristin in lipid clinic in April of 2016. At that time, pt wanted to focus on lifestyle changes to lower his cholesterol. He was referred again to lipid clinic when his LDL remained 180.   He presents to clinic today with his wife and son. He reports that he has not been sticking to his diet or exercise regimen. He has never taken any medications for his cholesterol and again insists that he wants to try lifestyle modifications because he does not want to take any medication.  Current Medications: none Intolerances: none Risk Factors: LDL 180 LDL goal: 130mg /dL for primary prevention  Diet: Has not been sticking to his diet. Likes [employer] and sugary food. States he needs to work on his portions.  Exercise: Has a work treadmill but hasn't been using it., Reports that he walks frequently.  Family History: Non-contributory for CVD.  Labs: 01/2015: TC 257, TG 195, HDL 38, LDL 180 (no therapy) 05/2014: TC 246, TG 230, HDL 40.2, LDL-d 185, LFTs wnl (no therapy)  Past Medical History  Diagnosis Date  . Brugada syndrome   . 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   No current facility-administered medications on file prior to visit.    No Known Allergies  Assessment/Plan:  1. Hyperlipidemia - pt's elevated at 180 above goal 130mg /dL for primary prevention. He was seen in lipid clinic in April 2016 and at that time was insistent on lifestyle modifications to lower his cholesterol. Lipid panel rechecked 8 months later and LDL still elevated. Pt again  refuses medication therapy at this time for his cholesterol. Discussed benefit of statin therapy in reducing CVD risk. Pt and wife report that if lipids are elevated in another 6 months at recheck, he will start medication at that time. Discussed lifestyle modifications and pt will focus on this until lipids are rechecked.   Matthew Chan, PharmD [facility] [address], [place], [place] [address] Phone: [phone]; Fax: [phone] 02/18/2015 5:00 PM

## 2015-02-18 NOTE — Progress Notes (Signed)
      HPI [name] is referred today by Dr. Johney FrameAllred to consider ICD system extraction. He was diagnosed with Brugada syndrome and syncope and underwent ICD implant 8 years ago. In the interim he has had no recurrent syncope and genetic testing demonstrated no Brugada gene. He has reached ERI. He does not want a new ICD system. He is here to see me today to discuss device removal. He has never had an ICD shock.  No Known Allergies   No current outpatient prescriptions on file.   No current facility-administered medications for this visit.     Past Medical History  Diagnosis Date  . Brugada syndrome   . 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   Past Surgical History  Procedure Laterality Date  . Cardiac defibrillator placement  08-25-2006    medtronic, Maximo VR Defibrillator implanted by Dr [name] in [place]     Family History  Problem Relation Age of Onset  . Hypertension    . Diabetes       Social History   Social History  . Marital Status: Married    Spouse Name: N/A  . Number of Children: N/A  . Years of Education: N/A   Occupational History  . Not on file.   Social History Main Topics  . Smoking status: Never Smoker   . Smokeless tobacco: Not on file  . Alcohol Use: Yes     Comment: occasionally   . Drug Use: No  . Sexual Activity: Not on file   Other Topics Concern  . Not on file   Social History Narrative   Arts administratoroftware industrial applications consultant.  [place] in [place], moved here from [place]     BP 124/72 mmHg  Pulse 81  Ht 5\' 8"  (1.727 m)  Wt 191 lb 3.2 oz (86.728 kg)  BMI 29.08 kg/m2  SpO2 96%  Physical Exam:  Well appearing NAD HEENT: Unremarkable Neck:  No JVD, no thyromegally Lymphatics:  No adenopathy Back:  No CVA tenderness Lungs:  Clear  with no wheezes.  HEART:  Regular rate rhythm, no murmurs, no rubs, no clicks Abd:  soft, positive bowel sounds, no organomegally, no rebound, no guarding Ext:  2 plus pulses, no edema, no cyanosis, no clubbing Skin:  No rashes no nodules Neuro:  CN II through XII intact, motor grossly intact   DEVICE  Normal device function.  See PaceArt for details. Device at [facility].  Assess/Plan:

## 2015-02-18 NOTE — Assessment & Plan Note (Signed)
Because of his young age, I have offered the patient the option of an ICD system extraction. The risks/benefits/goals/expectations of ICD system extraction have been removed and he will call us if wishes to proceed.

## 2015-02-20 ENCOUNTER — Telehealth: Payer: Self-pay | Admitting: Internal Medicine

## 2015-02-20 NOTE — Telephone Encounter (Signed)
New problem   Pt want to schedule his surgery date for 1.20.16. Please advise pt.

## 2015-02-20 NOTE — Telephone Encounter (Signed)
Patient requests to have device extraction March 21, 2015. Explained to patient that Dr. Lubertha Basqueaylor's nurse is not in the office today, but she will be notified and will call him back with further instructions. Patient agrees with treatment plan.

## 2015-02-21 NOTE — Telephone Encounter (Signed)
I have sent a request to Darius Bumpyan Brooks, RN with [facility] to get this scheduled for 03/21/15.

## 2015-02-25 NOTE — Telephone Encounter (Signed)
Returned call to patient and let him know that the scheduling process is in the works and I am waiting to hear back from the surgeons office for back up

## 2015-02-25 NOTE — Telephone Encounter (Signed)
F/u    Pt calling to discuss his surgery date. Please call pt.

## 2015-02-26 LAB — CUP PACEART INCLINIC DEVICE CHECK
Brady Statistic RV Percent Paced: 0 %
Date Time Interrogation Session: 20161207174143
HIGH POWER IMPEDANCE MEASURED VALUE: 46 Ohm
HIGH POWER IMPEDANCE MEASURED VALUE: 49 Ohm
HIGH POWER IMPEDANCE MEASURED VALUE: 49 Ohm
HIGH POWER IMPEDANCE MEASURED VALUE: 49 Ohm
HIGH POWER IMPEDANCE MEASURED VALUE: 49 Ohm
HIGH POWER IMPEDANCE MEASURED VALUE: 50 Ohm
HIGH POWER IMPEDANCE MEASURED VALUE: 61 Ohm
HIGH POWER IMPEDANCE MEASURED VALUE: 62 Ohm
HIGH POWER IMPEDANCE MEASURED VALUE: 62 Ohm
HIGH POWER IMPEDANCE MEASURED VALUE: 64 Ohm
HIGH POWER IMPEDANCE MEASURED VALUE: 68 Ohm
HighPow Impedance: 47 Ohm
HighPow Impedance: 47 Ohm
HighPow Impedance: 47 Ohm
HighPow Impedance: 47 Ohm
HighPow Impedance: 47 Ohm
HighPow Impedance: 48 Ohm
HighPow Impedance: 48 Ohm
HighPow Impedance: 49 Ohm
HighPow Impedance: 50 Ohm
HighPow Impedance: 52 Ohm
HighPow Impedance: 62 Ohm
HighPow Impedance: 62 Ohm
HighPow Impedance: 62 Ohm
HighPow Impedance: 62 Ohm
HighPow Impedance: 62 Ohm
HighPow Impedance: 62 Ohm
HighPow Impedance: 63 Ohm
HighPow Impedance: 63 Ohm
HighPow Impedance: 69 Ohm
HighPow Impedance: 69 Ohm
Implantable Lead Location: 753860
Implantable Lead Model: 6947
Lead Channel Impedance Value: 392 Ohm
Lead Channel Impedance Value: 400 Ohm
Lead Channel Impedance Value: 408 Ohm
Lead Channel Impedance Value: 408 Ohm
Lead Channel Impedance Value: 408 Ohm
Lead Channel Impedance Value: 408 Ohm
Lead Channel Impedance Value: 416 Ohm
Lead Channel Impedance Value: 424 Ohm
Lead Channel Impedance Value: 432 Ohm
Lead Channel Impedance Value: 440 Ohm
Lead Channel Sensing Intrinsic Amplitude: 12.7 mV
Lead Channel Sensing Intrinsic Amplitude: 13 mV
Lead Channel Sensing Intrinsic Amplitude: 13 mV
Lead Channel Sensing Intrinsic Amplitude: 13.2 mV
Lead Channel Sensing Intrinsic Amplitude: 13.6 mV
Lead Channel Sensing Intrinsic Amplitude: 14 mV
Lead Channel Sensing Intrinsic Amplitude: 14.3 mV
Lead Channel Sensing Intrinsic Amplitude: 14.6 mV
Lead Channel Sensing Intrinsic Amplitude: 15.9 mV
Lead Channel Setting Pacing Amplitude: 2.5 V
Lead Channel Setting Pacing Pulse Width: 0.6 ms
Lead Channel Setting Sensing Sensitivity: 0.3 mV
MDC IDC LEAD IMPLANT DT: 20080626
MDC IDC MSMT BATTERY VOLTAGE: 2.62 V
MDC IDC MSMT LEADCHNL RV IMPEDANCE VALUE: 400 Ohm
MDC IDC MSMT LEADCHNL RV IMPEDANCE VALUE: 400 Ohm
MDC IDC MSMT LEADCHNL RV IMPEDANCE VALUE: 408 Ohm
MDC IDC MSMT LEADCHNL RV IMPEDANCE VALUE: 408 Ohm
MDC IDC MSMT LEADCHNL RV IMPEDANCE VALUE: 408 Ohm
MDC IDC MSMT LEADCHNL RV SENSING INTR AMPL: 13.7 mV
MDC IDC MSMT LEADCHNL RV SENSING INTR AMPL: 13.9 mV
MDC IDC MSMT LEADCHNL RV SENSING INTR AMPL: 14.2 mV
MDC IDC MSMT LEADCHNL RV SENSING INTR AMPL: 14.4 mV
MDC IDC MSMT LEADCHNL RV SENSING INTR AMPL: 15 mV
MDC IDC MSMT LEADCHNL RV SENSING INTR AMPL: 15.2 mV

## 2015-03-02 HISTORY — PX: OTHER SURGICAL HISTORY: SHX169

## 2015-03-06 NOTE — Telephone Encounter (Signed)
Spoke with patient and he is aware of date of procedure for 03/21/15 and to be at the hospital at 12:00 noon.  NPO after midnight and will obtain labs at the hospital on 03/18/15

## 2015-03-18 ENCOUNTER — Encounter (HOSPITAL_COMMUNITY)
Admission: RE | Admit: 2015-03-18 | Discharge: 2015-03-18 | Disposition: A | Discharge status: Home / Self Care | Payer: Managed Care, Other (non HMO) | Source: Ambulatory Visit | Attending: Internal Medicine | Admitting: Internal Medicine

## 2015-03-18 ENCOUNTER — Encounter (HOSPITAL_COMMUNITY): Payer: Self-pay

## 2015-03-18 DIAGNOSIS — E785 Hyperlipidemia, unspecified: Secondary | ICD-10-CM | POA: Diagnosis not present

## 2015-03-18 DIAGNOSIS — I471 Supraventricular tachycardia: Secondary | ICD-10-CM | POA: Diagnosis present

## 2015-03-18 HISTORY — DX: Hyperlipidemia, unspecified: E78.5

## 2015-03-18 LAB — CBC
HEMATOCRIT: 44.1 % (ref 39.0–52.0)
Hemoglobin: 14.7 g/dL (ref 13.0–17.0)
MCH: 27.5 pg (ref 26.0–34.0)
MCHC: 33.3 g/dL (ref 30.0–36.0)
MCV: 82.6 fL (ref 78.0–100.0)
Platelets: 206 10*3/uL (ref 150–400)
RBC: 5.34 MIL/uL (ref 4.22–5.81)
RDW: 14.5 % (ref 11.5–15.5)
WBC: 6.9 10*3/uL (ref 4.0–10.5)

## 2015-03-18 LAB — BASIC METABOLIC PANEL
Anion gap: 6 (ref 5–15)
BUN: 12 mg/dL (ref 6–20)
CO2: 29 mmol/L (ref 22–32)
Calcium: 9.6 mg/dL (ref 8.9–10.3)
Chloride: 106 mmol/L (ref 101–111)
Creatinine, Ser: 1.07 mg/dL (ref 0.61–1.24)
GFR calc Af Amer: >60 mL/min (ref >60)
GFR calc non Af Amer: >60 mL/min (ref >60)
Glucose, Bld: 110 mg/dL — ABNORMAL HIGH (ref 65–99)
POTASSIUM: 4.2 mmol/L (ref 3.5–5.1)
Sodium: 141 mmol/L (ref 135–145)

## 2015-03-18 LAB — SURGICAL PCR SCREEN
MRSA, PCR: NEGATIVE
Staphylococcus aureus: NEGATIVE

## 2015-03-18 NOTE — Progress Notes (Signed)
Paged Dr.Taylor's PA, Amber for orders.Awaiting return call.

## 2015-03-18 NOTE — Progress Notes (Signed)
Paged Dr.Taylor to request orders.Awaiting return call

## 2015-03-18 NOTE — Pre-Procedure Instructions (Signed)
Matthew Chan  03/18/2015      [id number] - [place], [place] - [address] [place] [address] Phone: [phone] Fax: [phone]    Your procedure is scheduled on Fri, Jan 20 @ 2:20 PM  Report to [facility] Admitting at 12:20  PM  Call this number if you have problems the morning of surgery:  [phone]   Remember:  Do not eat food or drink liquids after midnight.               No Goody's,BC's,Aleve,Aspirin,Ibuprofen,Advil,Motrin,Fish Oil,or any Herbal Medications.   Do not wear jewelry.  Do not wear lotions, powders, or colognes.   Men may shave face and neck.  Do not bring valuables to the hospital.  [facility] is not responsible for any belongings or valuables.  Contacts, dentures or bridgework may not be worn into surgery.  Leave your suitcase in the car.  After surgery it may be brought to your room.  For patients admitted to the hospital, discharge time will be determined by your treatment team.  Patients discharged the day of surgery will not be allowed to drive home.    Special instructions:  [facility] - Preparing for Surgery  Before surgery, you can play an important role.  Because skin is not sterile, your skin needs to be as free of germs as possible.  You can reduce the number of germs on you skin by washing with CHG (chlorahexidine gluconate) soap before surgery.  CHG is an antiseptic cleaner which kills germs and bonds with the skin to continue killing germs even after washing.  Please DO NOT use if you have an allergy to CHG or antibacterial soaps.  If your skin becomes reddened/irritated stop using the CHG and inform your nurse when you arrive at Short Stay.  Do not shave (including legs and underarms) for at least 48 hours prior to the first CHG shower.  You may shave your face.  Please follow these instructions carefully:   1.  Shower with CHG Soap the night before surgery and the                                 morning of Surgery.  2.  If you choose to wash your hair, wash your hair first as usual with your       normal shampoo.  3.  After you shampoo, rinse your hair and body thoroughly to remove the                      Shampoo.  4.  Use CHG as you would any other liquid soap.  You can apply chg directly       to the skin and wash gently with scrungie or a clean washcloth.  5.  Apply the CHG Soap to your body ONLY FROM THE NECK DOWN.        Do not use on open wounds or open sores.  Avoid contact with your eyes,       ears, mouth and genitals (private parts).  Wash genitals (private parts)       with your normal soap.  6.  Wash thoroughly, paying special attention to the area where your surgery        will be performed.  7.  Thoroughly rinse your body with warm water from the neck down.  8.  DO NOT shower/wash with your normal soap after using and rinsing off       the CHG Soap.  9.  Pat yourself dry with a clean towel.            10.  Wear clean pajamas.            11.  Place clean sheets on your bed the night of your first shower and do not        sleep with pets.  Day of Surgery  Do not apply any lotions/deoderants the morning of surgery.  Please wear clean clothes to the hospital/surgery center.    Please read over the following fact sheets that you were given. Pain Booklet, Coughing and Deep Breathing, MRSA Information and Surgical Site Infection Prevention

## 2015-03-18 NOTE — Progress Notes (Addendum)
Cardiologist is Dr.Allred with last visit Dec 2016  Medical Md doesn't have one  EKG in epic from 02-05-15  Echo report in epic from 2016  Stress test done in 2008  Heart cath in 2008  Denies CXR in past yr

## 2015-03-18 NOTE — Progress Notes (Signed)
Left a message for Matthew Chan about pts upcoming surgery with date and arrival time included

## 2015-03-18 NOTE — Progress Notes (Signed)
Dr.Taylor returned call and will get orders into epic

## 2015-03-21 ENCOUNTER — Encounter (HOSPITAL_COMMUNITY): Payer: Self-pay | Admitting: Certified Registered"

## 2015-03-21 ENCOUNTER — Encounter (HOSPITAL_COMMUNITY)
Admission: RE | Disposition: A | Discharge status: Home / Self Care | Payer: Self-pay | Source: Ambulatory Visit | Attending: Internal Medicine

## 2015-03-21 ENCOUNTER — Ambulatory Visit (HOSPITAL_BASED_OUTPATIENT_CLINIC_OR_DEPARTMENT_OTHER): Payer: Managed Care, Other (non HMO)

## 2015-03-21 ENCOUNTER — Ambulatory Visit (HOSPITAL_COMMUNITY): Payer: Managed Care, Other (non HMO) | Admitting: Certified Registered"

## 2015-03-21 ENCOUNTER — Ambulatory Visit (HOSPITAL_COMMUNITY)
Admission: RE | Admit: 2015-03-21 | Discharge: 2015-03-22 | Disposition: A | Discharge status: Home / Self Care | Payer: Managed Care, Other (non HMO) | Source: Ambulatory Visit | Attending: Internal Medicine | Admitting: Internal Medicine

## 2015-03-21 DIAGNOSIS — Z95810 Presence of automatic (implantable) cardiac defibrillator: Secondary | ICD-10-CM | POA: Diagnosis not present

## 2015-03-21 DIAGNOSIS — I471 Supraventricular tachycardia: Secondary | ICD-10-CM | POA: Insufficient documentation

## 2015-03-21 DIAGNOSIS — Q248 Other specified congenital malformations of heart: Secondary | ICD-10-CM

## 2015-03-21 DIAGNOSIS — Z4502 Encounter for adjustment and management of automatic implantable cardiac defibrillator: Secondary | ICD-10-CM | POA: Diagnosis not present

## 2015-03-21 DIAGNOSIS — E785 Hyperlipidemia, unspecified: Secondary | ICD-10-CM | POA: Insufficient documentation

## 2015-03-21 HISTORY — DX: Supraventricular tachycardia: I47.1

## 2015-03-21 HISTORY — DX: Supraventricular tachycardia, unspecified: I47.10

## 2015-03-21 HISTORY — PX: ICD LEAD REMOVAL: SHX5855

## 2015-03-21 LAB — CBC
HCT: 41.3 % (ref 39.0–52.0)
HEMOGLOBIN: 13.9 g/dL (ref 13.0–17.0)
MCH: 27.4 pg (ref 26.0–34.0)
MCHC: 33.7 g/dL (ref 30.0–36.0)
MCV: 81.5 fL (ref 78.0–100.0)
PLATELETS: 196 10*3/uL (ref 150–400)
RBC: 5.07 MIL/uL (ref 4.22–5.81)
RDW: 14.6 % (ref 11.5–15.5)
WBC: 10.4 10*3/uL (ref 4.0–10.5)

## 2015-03-21 LAB — PREPARE RBC (CROSSMATCH)

## 2015-03-21 LAB — CREATININE, SERUM
CREATININE: 1.26 mg/dL — AB (ref 0.61–1.24)
GFR calc non Af Amer: >60 mL/min (ref >60)

## 2015-03-21 LAB — ABO/RH: ABO/RH(D): B POS

## 2015-03-21 SURGERY — REMOVAL, ELECTRODE LEAD, ICD
Anesthesia: General | Site: Chest | Laterality: Left

## 2015-03-21 MED ORDER — ONDANSETRON HCL 4 MG/2ML IJ SOLN: 4.0000 mg | Freq: Four times a day (QID) | INTRAMUSCULAR | Status: DC | PRN

## 2015-03-21 MED ORDER — LACTATED RINGERS IV SOLN
INTRAVENOUS | Status: DC
  Administered 2015-03-21 (×2): via INTRAVENOUS

## 2015-03-21 MED ORDER — OXYCODONE HCL 5 MG PO TABS: 5.0000 mg | ORAL_TABLET | Freq: Once | ORAL | Status: DC | PRN

## 2015-03-21 MED ORDER — SODIUM CHLORIDE 0.9 % IV SOLN: INTRAVENOUS | Status: DC

## 2015-03-21 MED ORDER — MIDAZOLAM HCL 2 MG/2ML IJ SOLN
INTRAMUSCULAR | Status: AC
  Filled 2015-03-21: qty 2

## 2015-03-21 MED ORDER — PHENYLEPHRINE 40 MCG/ML (10ML) SYRINGE FOR IV PUSH (FOR BLOOD PRESSURE SUPPORT)
PREFILLED_SYRINGE | INTRAVENOUS | Status: AC
  Filled 2015-03-21: qty 10

## 2015-03-21 MED ORDER — FENTANYL CITRATE (PF) 250 MCG/5ML IJ SOLN
INTRAMUSCULAR | Status: AC
  Filled 2015-03-21: qty 5

## 2015-03-21 MED ORDER — VECURONIUM BROMIDE 10 MG IV SOLR
INTRAVENOUS | Status: DC | PRN
  Administered 2015-03-21: 1 mg via INTRAVENOUS

## 2015-03-21 MED ORDER — ROCURONIUM BROMIDE 100 MG/10ML IV SOLN
INTRAVENOUS | Status: DC | PRN
  Administered 2015-03-21: 50 mg via INTRAVENOUS

## 2015-03-21 MED ORDER — EPHEDRINE SULFATE 50 MG/ML IJ SOLN
INTRAMUSCULAR | Status: DC | PRN
  Administered 2015-03-21: 5 mg via INTRAVENOUS
  Administered 2015-03-21 (×2): 10 mg via INTRAVENOUS

## 2015-03-21 MED ORDER — PROPOFOL 10 MG/ML IV BOLUS
INTRAVENOUS | Status: DC | PRN
  Administered 2015-03-21: 140 mg via INTRAVENOUS
  Administered 2015-03-21: 30 mg via INTRAVENOUS

## 2015-03-21 MED ORDER — LIDOCAINE HCL (CARDIAC) 20 MG/ML IV SOLN
INTRAVENOUS | Status: DC | PRN
  Administered 2015-03-21: 80 mg via INTRAVENOUS

## 2015-03-21 MED ORDER — ACETAMINOPHEN 325 MG PO TABS: 325.0000 mg | ORAL_TABLET | ORAL | Status: DC | PRN

## 2015-03-21 MED ORDER — SODIUM CHLORIDE 0.9 % IR SOLN
80.0000 mg | Status: DC
  Filled 2015-03-21: qty 2

## 2015-03-21 MED ORDER — FENTANYL CITRATE (PF) 250 MCG/5ML IJ SOLN
INTRAMUSCULAR | Status: DC | PRN
  Administered 2015-03-21: 150 ug via INTRAVENOUS

## 2015-03-21 MED ORDER — LIDOCAINE HCL (CARDIAC) 20 MG/ML IV SOLN
INTRAVENOUS | Status: AC
  Filled 2015-03-21: qty 5

## 2015-03-21 MED ORDER — VECURONIUM BROMIDE 10 MG IV SOLR
INTRAVENOUS | Status: AC
  Filled 2015-03-21: qty 10

## 2015-03-21 MED ORDER — ACETAMINOPHEN 160 MG/5ML PO SOLN
325.0000 mg | ORAL | Status: DC | PRN
  Filled 2015-03-21: qty 20.3

## 2015-03-21 MED ORDER — SODIUM CHLORIDE 0.9 % IR SOLN
Status: DC | PRN
  Administered 2015-03-21: 500 mL

## 2015-03-21 MED ORDER — SODIUM CHLORIDE 0.9 % IV SOLN: INTRAVENOUS | Status: DC | PRN

## 2015-03-21 MED ORDER — LIDOCAINE HCL (PF) 1 % IJ SOLN
INTRAMUSCULAR | Status: AC
  Filled 2015-03-21: qty 30

## 2015-03-21 MED ORDER — ONDANSETRON HCL 4 MG/2ML IJ SOLN
INTRAMUSCULAR | Status: DC | PRN
  Administered 2015-03-21: 4 mg via INTRAVENOUS

## 2015-03-21 MED ORDER — FENTANYL CITRATE (PF) 100 MCG/2ML IJ SOLN: 25.0000 ug | INTRAMUSCULAR | Status: DC | PRN

## 2015-03-21 MED ORDER — HEPARIN SODIUM (PORCINE) 5000 UNIT/ML IJ SOLN
5000.0000 [IU] | Freq: Three times a day (TID) | INTRAMUSCULAR | Status: DC
  Administered 2015-03-22: 5000 [IU] via SUBCUTANEOUS
  Filled 2015-03-21: qty 1

## 2015-03-21 MED ORDER — NEOSTIGMINE METHYLSULFATE 10 MG/10ML IV SOLN
INTRAVENOUS | Status: DC | PRN
  Administered 2015-03-21: 3 mg via INTRAVENOUS

## 2015-03-21 MED ORDER — OXYCODONE HCL 5 MG/5ML PO SOLN: 5.0000 mg | Freq: Once | ORAL | Status: DC | PRN

## 2015-03-21 MED ORDER — PHENYLEPHRINE HCL 10 MG/ML IJ SOLN
INTRAMUSCULAR | Status: DC | PRN
  Administered 2015-03-21: 80 ug via INTRAVENOUS
  Administered 2015-03-21 (×3): 40 ug via INTRAVENOUS

## 2015-03-21 MED ORDER — CEFAZOLIN SODIUM-DEXTROSE 2-3 GM-% IV SOLR
2.0000 g | INTRAVENOUS | Status: DC
  Filled 2015-03-21: qty 50

## 2015-03-21 MED ORDER — GLYCOPYRROLATE 0.2 MG/ML IJ SOLN
INTRAMUSCULAR | Status: DC | PRN
  Administered 2015-03-21: 0.4 mg via INTRAVENOUS

## 2015-03-21 MED ORDER — SODIUM CHLORIDE 0.9 % IV SOLN
INTRAVENOUS | Status: DC | PRN
  Administered 2015-03-21: 500 mL

## 2015-03-21 MED ORDER — STERILE WATER FOR INJECTION IJ SOLN
INTRAMUSCULAR | Status: AC
  Filled 2015-03-21: qty 10

## 2015-03-21 SURGICAL SUPPLY — 59 items
BAG BANDED W/RUBBER/TAPE 36X54 (MISCELLANEOUS) ×3 IMPLANT
BAG DECANTER FOR FLEXI CONT (MISCELLANEOUS) IMPLANT
BENZOIN TINCTURE PRP APPL 2/3 (GAUZE/BANDAGES/DRESSINGS) ×3 IMPLANT
BLADE 10 SAFETY STRL DISP (BLADE) IMPLANT
BLADE STERNUM SYSTEM 6 (BLADE) ×3 IMPLANT
BLADE SURG 10 STRL SS (BLADE) ×3 IMPLANT
BLADE SURG ROTATE 9660 (MISCELLANEOUS) ×3 IMPLANT
BNDG COHESIVE 4X5 WHT NS (GAUZE/BANDAGES/DRESSINGS) IMPLANT
CANISTER SUCTION 2500CC (MISCELLANEOUS) ×3 IMPLANT
CLOSURE WOUND 1/2 X4 (GAUZE/BANDAGES/DRESSINGS) ×1
COIL ONE TIE COMPRESSION (MISCELLANEOUS) ×3 IMPLANT
COVER SURGICAL LIGHT HANDLE (MISCELLANEOUS) ×3 IMPLANT
COVER TABLE BACK 60X90 (DRAPES) ×3 IMPLANT
DRAPE C-ARM 42X72 X-RAY (DRAPES) IMPLANT
DRAPE CARDIOVASCULAR INCISE (DRAPES) ×2
DRAPE INCISE IOBAN 66X45 STRL (DRAPES) IMPLANT
DRAPE PROXIMA HALF (DRAPES) ×6 IMPLANT
DRAPE SRG 135X102X78XABS (DRAPES) ×1 IMPLANT
DRSG TEGADERM 4X4.75 (GAUZE/BANDAGES/DRESSINGS) ×3 IMPLANT
ELECT REM PT RETURN 9FT ADLT (ELECTROSURGICAL) ×6
ELECTRODE REM PT RTRN 9FT ADLT (ELECTROSURGICAL) ×2 IMPLANT
GAUZE PACKING IODOFORM 1 (PACKING) IMPLANT
GAUZE SPONGE 4X4 12PLY STRL (GAUZE/BANDAGES/DRESSINGS) ×3 IMPLANT
GAUZE SPONGE 4X4 16PLY XRAY LF (GAUZE/BANDAGES/DRESSINGS) IMPLANT
GLOVE BIO SURGEON STRL SZ 6.5 (GLOVE) ×4 IMPLANT
GLOVE BIO SURGEONS STRL SZ 6.5 (GLOVE) ×2
GLOVE BIOGEL PI IND STRL 6.5 (GLOVE) ×3 IMPLANT
GLOVE BIOGEL PI IND STRL 7.5 (GLOVE) ×1 IMPLANT
GLOVE BIOGEL PI INDICATOR 6.5 (GLOVE) ×6
GLOVE BIOGEL PI INDICATOR 7.5 (GLOVE) ×2
GLOVE ECLIPSE 8.0 STRL XLNG CF (GLOVE) ×3 IMPLANT
GLOVE SS BIOGEL STRL SZ 6.5 (GLOVE) ×1 IMPLANT
GLOVE SUPERSENSE BIOGEL SZ 6.5 (GLOVE) ×2
GOWN STRL REUS W/ TWL LRG LVL3 (GOWN DISPOSABLE) ×4 IMPLANT
GOWN STRL REUS W/ TWL XL LVL3 (GOWN DISPOSABLE) ×1 IMPLANT
GOWN STRL REUS W/TWL LRG LVL3 (GOWN DISPOSABLE) ×8
GOWN STRL REUS W/TWL XL LVL3 (GOWN DISPOSABLE) ×2
KIT ROOM TURNOVER OR (KITS) ×3 IMPLANT
NEEDLE PERC 18GX7CM (NEEDLE) ×3 IMPLANT
NS IRRIG 1000ML POUR BTL (IV SOLUTION) IMPLANT
PAD ARMBOARD 7.5X6 YLW CONV (MISCELLANEOUS) ×6 IMPLANT
PAD ELECT DEFIB RADIOL ZOLL (MISCELLANEOUS) ×3 IMPLANT
SHEATH EVOLUTION RL 11F (SHEATH) ×3 IMPLANT
SHEATH EVOLUTION RL 13F (SHEATH) ×3 IMPLANT
SHEATH EVOLUTION SHORTE RL 11F (SHEATH) ×3 IMPLANT
SHEATH PINNACLE 6F 10CM (SHEATH) ×3 IMPLANT
SPONGE GAUZE 4X4 12PLY STER LF (GAUZE/BANDAGES/DRESSINGS) ×3 IMPLANT
STRIP CLOSURE SKIN 1/2X4 (GAUZE/BANDAGES/DRESSINGS) ×2 IMPLANT
STYLET LIBERATOR LOCKING (MISCELLANEOUS) ×3 IMPLANT
SUT PROLENE 2 0 CT2 30 (SUTURE) IMPLANT
SUT PROLENE 2 0 SH DA (SUTURE) IMPLANT
SUT VIC AB 2-0 CT2 18 VCP726D (SUTURE) IMPLANT
SUT VIC AB 3-0 X1 27 (SUTURE) ×3 IMPLANT
TOWEL OR 17X24 6PK STRL BLUE (TOWEL DISPOSABLE) ×6 IMPLANT
TOWEL OR 17X26 10 PK STRL BLUE (TOWEL DISPOSABLE) ×6 IMPLANT
TRAY FOLEY CATH 16FRSI W/METER (SET/KITS/TRAYS/PACK) IMPLANT
TUBE CONNECTING 12'X1/4 (SUCTIONS)
TUBE CONNECTING 12X1/4 (SUCTIONS) IMPLANT
YANKAUER SUCT BULB TIP NO VENT (SUCTIONS) ×3 IMPLANT

## 2015-03-21 NOTE — Anesthesia Procedure Notes (Signed)
Procedure Name: Intubation Date/Time: 03/21/2015 1:40 PM Performed by: Jerilee Hoh Pre-anesthesia Checklist: Patient identified, Emergency Drugs available, Suction available and Patient being monitored Patient Re-evaluated:Patient Re-evaluated prior to inductionOxygen Delivery Method: Circle system utilized Preoxygenation: Pre-oxygenation with 100% oxygen Intubation Type: IV induction Ventilation: Mask ventilation without difficulty Laryngoscope Size: Mac and 4 Grade View: Grade II Tube type: Oral Tube size: 7.5 mm Number of attempts: 1 Airway Equipment and Method: Stylet Placement Confirmation: ETT inserted through vocal cords under direct vision,  positive ETCO2 and breath sounds checked- equal and bilateral Secured at: 22 cm Tube secured with: Tape Dental Injury: Teeth and Oropharynx as per pre-operative assessment

## 2015-03-21 NOTE — Anesthesia Preprocedure Evaluation (Signed)
Anesthesia Evaluation  Patient identified by MRN, date of birth, ID band Patient awake    Reviewed: Allergy & Precautions, NPO status , Patient's Chart, lab work & pertinent test results  History of Anesthesia Complications Negative for: history of anesthetic complications  Airway Mallampati: II  TM Distance: >3 FB Neck ROM: Full    Dental  (+) Teeth Intact   Pulmonary neg pulmonary ROS,    breath sounds clear to auscultation       Cardiovascular negative cardio ROS   Rhythm:Regular     Neuro/Psych negative neurological ROS  negative psych ROS   GI/Hepatic negative GI ROS, Neg liver ROS,   Endo/Other  negative endocrine ROS  Renal/GU negative Renal ROS     Musculoskeletal   Abdominal   Peds  Hematology negative hematology ROS (+)   Anesthesia Other Findings   Reproductive/Obstetrics                             Anesthesia Physical Anesthesia Plan  ASA: I  Anesthesia Plan: General   Post-op Pain Management:    Induction: Intravenous  Airway Management Planned: Oral ETT  Additional Equipment: Arterial line  Intra-op Plan:   Post-operative Plan: Extubation in OR  Informed Consent: I have reviewed the patients History and Physical, chart, labs and discussed the procedure including the risks, benefits and alternatives for the proposed anesthesia with the patient or authorized representative who has indicated his/her understanding and acceptance.   Dental advisory given  Plan Discussed with: CRNA and Surgeon  Anesthesia Plan Comments:         Anesthesia Quick Evaluation

## 2015-03-21 NOTE — CV Procedure (Signed)
EP Procedure Note  Procedure: ICD system extraction  Preoperative diagnosis: s/p ICD with device at ERI and no need for another device  Postoperative diagnosis: same as preoperative diagnosis  Description of the procedure: after informed consent was obtained, the patient was taken to the operating room in the fasting state. The anesthesia service was utilized to provide general anesthesia. Transesophageal echocardiographic monitoring was performed during the procedure. A 6 French hemostatic sheath was inserted into the right femoral vein. A 7 cm incision was carried out in the left infraclavicular region. Electrocautery was utilized to dissect down to the ICD generator. The generator was removed with gentle traction. The ICD lead was freed up from its dense fibrous adhesions with electrocautery. A stylette was inserted into the defibrillator lead and the helix retracted into the body of the lead. The ICD lead was cut. A locking stylette was inserted into the lead. A one tie suture was placed on the proximal portion of the lead. A 11 French RL short sheath, Adriana Simas, was inserted over the defibrillator lead to the junction of the subclavian vein and the innominate vein. A long 11 French [place] RL sheath was then advanced over the defibrillator lead but could not traverse the proximal coil. The outer sheath was then utilized and could be advanced over the defibrillator lead all the way down to the distal coil. Attempts to advance the inner sheath were unsuccessful. At this point, the [facility] RL 13 [place] sheath was advanced over the defibrillator lead down to the distal coil. There was fairly dense fibrous adhesions and a combination of traction and countertraction, pressure and counter pressure were applied while the cutting mechanism of the sheath was deployed. The lead became free and was removed completely. Hemostasis was obtained with gentle pressure. Fluoroscopy and then transesophageal echocardiography  demonstrated no pericardial effusion. The patient's blood pressure was stable. His ICD pocket was freed up of its fibrous adhesions and electrocautery was utilized to assure hemostasis. The pocket was irrigated with antibiotic irrigation. The incision was closed with 2 layers of Vicryl suture. Benzoin and Steri-Strips her pain on the skin, pressure was applied, and the patient was returned to the recovery area in satisfactory condition.  Complications: There were no immediate procedure complications  Estimated blood loss: 50 cc  Conclusion: Successful extraction of a Medtronic active fixation single chamber defibrillator in a patient who was found to have no evidence of Brugada syndrome, and who desired to have his ICD system removed in total.  Lewayne Bunting, M.D.

## 2015-03-21 NOTE — Progress Notes (Signed)
Pt transferred from surgical procedure.  Telemetry applied and CCMD notified.  Pt oriented to room including telephone and call light.  Pt requesting vegetarian dinner.  Request called to dietary.  Pt denies pain at this time.  Will cont to monitor.

## 2015-03-21 NOTE — Transfer of Care (Signed)
Immediate Anesthesia Transfer of Care Note  Patient: Matthew Chan  Procedure(s) Performed: Procedure(s): ICD LEAD REMOVAL (Left)  Patient Location: PACU  Anesthesia Type:General  Level of Consciousness: awake, alert , oriented and patient cooperative  Airway & Oxygen Therapy: Patient Spontanous Breathing and Patient connected to nasal cannula oxygen  Post-op Assessment: Report given to RN, Post -op Vital signs reviewed and stable and Patient moving all extremities  Post vital signs: Reviewed and stable  Last Vitals:  Filed Vitals:   03/21/15 1131  BP: 130/88  Pulse: 72  Temp: 36.3 C  Resp: 20    Complications: No apparent anesthesia complications

## 2015-03-21 NOTE — H&P (Signed)
HPI Matthew Chan is referred today by Dr. Johney Frame to consider ICD system extraction. He was diagnosed with Brugada syndrome and syncope and underwent ICD implant 8 years ago. In the interim he has had no recurrent syncope and genetic testing demonstrated no Brugada gene. He has reached ERI. He does not want a new ICD system. He is here to see me today to discuss device removal. He has never had an ICD shock.  No Known Allergies   No current outpatient prescriptions on file.   No current facility-administered medications for this visit.     Past Medical History  Diagnosis Date  . Brugada syndrome   . 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   Past Surgical History  Procedure Laterality Date  . Cardiac defibrillator placement  08-25-2006    medtronic, Maximo VR Defibrillator implanted by Dr Ernestine Conrad in [place]     Family History  Problem Relation Age of Onset  . Hypertension    . Diabetes       Social History   Social History  . Marital Status: Married    Spouse Name: N/A  . Number of Children: N/A  . Years of Education: N/A   Occupational History  . Not on file.   Social History Main Topics  . Smoking status: Never Smoker   . Smokeless tobacco: Not on file  . Alcohol Use: Yes     Comment: occasionally   . Drug Use: No  . Sexual Activity: Not on file   Other Topics Concern  . 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  HEART: Regular rate rhythm, no murmurs, no rubs, no clicks Abd: soft, positive bowel sounds, no organomegally, no rebound, no guarding Ext: 2 plus pulses, no edema, no cyanosis, no clubbing Skin: No rashes no nodules Neuro: CN II through XII intact, motor grossly intact   DEVICE  Normal device function. See PaceArt for details. Device at [employer].  Assess/Plan:            ICD (implantable cardioverter-defibrillator) battery depletion - Matthew Maw, MD at 02/18/2015 3:28 PM     Status: Written Related Problem: ICD (implantable cardioverter-defibrillator) battery depletion   Expand All Collapse All   Because of his young age, I have offered the patient the option of an ICD system extraction. The risks/benefits/goals/expectations of ICD system extraction have been removed and he will call us if wishes to proceed.             Brugada syndrome - Matthew Maw, MD at 02/18/2015 3:28 PM     Status: Written Related Problem: Brugada syndrome   Expand All Collapse All   Despite his remote history of syncope and Brugada pattern on ECG, his genetic testing was normal.          EP Attending  Patient seen and examined. Agree with the findings as noted above. Since prior clinic visit, no  change in the history, exam, assessment and plan. For ICD system removal after confirmation that the patient does not have Brugada syndrome despite an abnormal ecg.  Matthew Chan.D.

## 2015-03-21 NOTE — Progress Notes (Signed)
  Echocardiogram Echocardiogram Transesophageal has been performed.  Nolon Rod 03/21/2015, 3:11 PM

## 2015-03-22 ENCOUNTER — Encounter (HOSPITAL_COMMUNITY): Payer: Self-pay | Admitting: Physician Assistant

## 2015-03-22 ENCOUNTER — Ambulatory Visit (HOSPITAL_COMMUNITY): Payer: Managed Care, Other (non HMO)

## 2015-03-22 DIAGNOSIS — I471 Supraventricular tachycardia: Secondary | ICD-10-CM

## 2015-03-22 DIAGNOSIS — Z95810 Presence of automatic (implantable) cardiac defibrillator: Secondary | ICD-10-CM | POA: Insufficient documentation

## 2015-03-22 LAB — POTASSIUM: Potassium: 3.9 mmol/L (ref 3.5–5.1)

## 2015-03-22 LAB — MAGNESIUM: Magnesium: 1.9 mg/dL (ref 1.7–2.4)

## 2015-03-22 MED ORDER — METOPROLOL TARTRATE 25 MG PO TABS: 25.0000 mg | ORAL_TABLET | Freq: Two times a day (BID) | ORAL | Status: DC

## 2015-03-22 MED ORDER — METOPROLOL TARTRATE 25 MG PO TABS: 25.0000 mg | ORAL_TABLET | Freq: Two times a day (BID) | ORAL | Status: AC

## 2015-03-22 NOTE — Progress Notes (Addendum)
SUBJECTIVE: The patient is doing well today.  Had ICD extraction done yesterday by Dr. Ladona Ridgel.  At this time, he denies chest pain, shortness of breath, or any new concerns.  Did have palpitations overnight which showed an episode of SVT.  Also had a 7 beat run on VT.  Otherwise asymptomatic without complaint.  . heparin subcutaneous  5,000 Units Subcutaneous 3 times per day      OBJECTIVE: Physical Exam: Filed Vitals:   03/21/15 2030 03/21/15 2230 03/21/15 2330 03/22/15 0438  BP: 117/62 118/70 130/70 119/72  Pulse: 65 59 59 68  Temp: 97.6 F (36.4 C)   97.6 F (36.4 C)  TempSrc: Oral   Oral  Resp: 18   17  Height:      Weight:      SpO2: 99%   99%    Intake/Output Summary (Last 24 hours) at 03/22/15 0850 Last data filed at 03/21/15 2234  Gross per 24 hour  Intake   2180 ml  Output   1075 ml  Net   1105 ml    Telemetry reveals sinus rhythm with an episode of NSVT (7 beats) and SVT  GEN- The patient is well appearing, alert and oriented x 3 today.   Head- normocephalic, atraumatic Eyes-  Sclera clear, conjunctiva pink Ears- hearing intact Oropharynx- clear Neck- supple, no JVP Lymph- no cervical lymphadenopathy Lungs- Clear to ausculation bilaterally, normal work of breathing Heart- Regular rate and rhythm, no murmurs, rubs or gallops, PMI not laterally displaced GI- soft, NT, ND, + BS Extremities- no clubbing, cyanosis, or edema Skin- no rash or lesion Psych- euthymic mood, full affect Neuro- strength and sensation are intact  LABS: Basic Metabolic Panel:  Recent Labs  16/10/96 1915 03/21/15 2019  K  --  3.9  CREATININE 1.26*  --   MG  --  1.9   Liver Function Tests: No results for input(s): AST, ALT, ALKPHOS, BILITOT, PROT, ALBUMIN in the last 72 hours. No results for input(s): LIPASE, AMYLASE in the last 72 hours. CBC:  Recent Labs  03/21/15 1915  WBC 10.4  HGB 13.9  HCT 41.3  MCV 81.5  PLT 196   Cardiac Enzymes: No results for input(s):  CKTOTAL, CKMB, CKMBINDEX, TROPONINI in the last 72 hours. BNP: Invalid input(s): POCBNP D-Dimer: No results for input(s): DDIMER in the last 72 hours. Hemoglobin A1C: No results for input(s): HGBA1C in the last 72 hours. Fasting Lipid Panel: No results for input(s): CHOL, HDL, LDLCALC, TRIG, CHOLHDL, LDLDIRECT in the last 72 hours. Thyroid Function Tests: No results for input(s): TSH, T4TOTAL, T3FREE, THYROIDAB in the last 72 hours.  Invalid input(s): FREET3 Anemia Panel: No results for input(s): VITAMINB12, FOLATE, FERRITIN, TIBC, IRON, RETICCTPCT in the last 72 hours.  RADIOLOGY: Dg Chest 2 View  03/22/2015  CLINICAL DATA:  Status post extraction of ICD. EXAM: CHEST - 2 VIEW COMPARISON:  10/08/2014 FINDINGS: Previously visualized left sided single chamber defibrillator and attached right ventricular lead have been completely removed. No retained device fragments. No pneumothorax, edema, consolidation or pleural fluid identified. The heart size and mediastinal contours are within normal limits. No bony abnormalities. IMPRESSION: Status post complete extraction of different later with no acute findings. Electronically Signed   By: Irish Lack M.D.   On: 03/22/2015 08:41    ASSESSMENT AND PLAN:  Active Problems:   Hyperlipidemia   S/P ICD (internal cardiac defibrillator) procedure ICD extraction yesterday without issue.  Had episode of SVT overnight, long RP tachycardia, also with  7 beat run of NSVT (strips in epic).  Cindy Brindisi plan to discharge today.  Yannis Broce start metoprolol 25 mg BID.    Jlynn Ly Jorja Loa, MD 03/22/2015 8:50 AM

## 2015-03-22 NOTE — Progress Notes (Signed)
03/22/2015 12:30 AM Pt appeared to have had about seven runs of VTach.  Asymptomatic except for feeling fluttering in his chest.  BP was 130/80 and heart rate was 59.  Dr. Tarri Glenn was paged and ordered blood draws for potassium and magnesium levels now.  Will continue to monitor pt. Harriet Masson, RN

## 2015-03-22 NOTE — Discharge Summary (Signed)
Discharge Summary    Patient ID: Matthew Chan,  MRN: [id number], DOB/AGE: [birth year] 45 y.o.  Admit date: 03/21/2015 Discharge date: 03/22/2015  Primary Care Provider: No PCP Per Patient Primary Cardiologist: Taylor/Allred (EP)  Discharge Diagnoses    Active Problems:   Hyperlipidemia   S/P ICD (internal cardiac defibrillator) procedure   SVT (supraventricular tachycardia) (HCC)   Allergies No Known Allergies  Diagnostic Studies/Procedures    Device extraction 03/21/15, see op note _____________   History of Present Illness     [name] is a 45 y/o M with history of prior ICD implantation and HLD who presented for device extraction.  Hospital Course     Per notes, he was diagnosed with Brugada syndrome and syncope and underwent ICD implant 8 years ago. In the interim he has had no recurrent syncope and genetic testing demonstrated no Brugada gene. He reached ERI and did not want a new ICD system. He has never had an ICD shock. He presented yesterday for planned device extraction which he tolerated well. Overnight telemetry did reveal brief runs of tachycardia - Dr. Elberta Fortis has reviewed these and feel they represent SVT. Per Dr .Elberta Fortis, strips Lennix Rotundo be available for review in Epic. He started the patient on metoprolol  BID. Potassium was 3.9 and K was 1.9. Dr. Elberta Fortis has seen and examined the patient today and feels he is stable for discharge.  The patient has previously arranged wound check on 1/30 - given SVT this admission and addition of BB, I have written a request to the schedulers to add on an office visit to the day of this wound check with EP doc/APP. _____________  Discharge Vitals Blood pressure 119/72, pulse 68, temperature 97.6 F (36.4 C), temperature source Oral, resp. rate 17, height  (1.727 m), weight 192 lb (87.091 kg), SpO2 99 %.  Filed Weights   03/21/15 1131  Weight: 192 lb (87.091 kg)    Labs & Radiologic Studies      CBC  Recent Labs  03/21/15 1915  WBC 10.4  HGB 13.9  HCT 41.3  MCV 81.5  PLT 196   Basic Metabolic Panel  Recent Labs  03/21/15 1915 03/21/15 2019  K  --  3.9  CREATININE 1.26*  --   MG  --  1.9   Dg Chest 2 View  03/22/2015  CLINICAL DATA:  Status post extraction of ICD. EXAM: CHEST - 2 VIEW COMPARISON:  10/08/2014 FINDINGS: Previously visualized left sided single chamber defibrillator and attached right ventricular lead have been completely removed. No retained device fragments. No pneumothorax, edema, consolidation or pleural fluid identified. The heart size and mediastinal contours are within normal limits. No bony abnormalities. IMPRESSION: Status post complete extraction of different later with no acute findings. Electronically Signed   By: Irish Lack M.D.   On: 03/22/2015 08:41    Disposition   Pt is being discharged home today in good condition.  Follow-up Plans & Appointments    Follow-up Information    Follow up with [facility] On 03/31/2015.   Specialty:  Cardiology   Why:  9:00AM for wound check. Office Dondre Catalfamo also call you to arrange a post-hospital appointment with your EP doctor or PA.   Contact information:   [address] [place] [address] [phone]      Follow up with Lewayne Bunting, MD On 06/20/2015.   Specialty:  Cardiology   Why:  9:15AM   Contact information:  [address] [place] [address] [phone]      Discharge Instructions    Diet - low sodium heart healthy    Complete by:  As directed      Increase activity slowly    Complete by:  As directed   No driving for 2 days. No lifting over 5 lbs for 1 week. You may return to work on Monday 03/24/15 if you are feeling well. Keep procedure site clean & dry. If you notice increased pain, swelling, bleeding or pus, call/return!  You may shower, but no soaking baths/hot tubs/pools for 1 week.            Discharge Medications   Current Discharge Medication List    START taking these medications   Details  metoprolol tartrate (LOPRESSOR) 25 MG tablet Take 1 tablet (25 mg total) by mouth 2 (two) times daily. Qty: 60 tablet, Refills: 2           Outstanding Labs/Studies   N/A  Duration of Discharge Encounter   Greater than 30 minutes including physician time.  Signed, Tacey Ruiz Dunn PA-C 03/22/2015, 8:56 AM     I have seen and examined this patient with Ronie Spies on 03/22/15.  Agree with above, note added to reflect my findings.  On exam, regular rhythm, no murmurs, lungs clear.  Had ICD extraction without complication.  Overnight did have episode of SVT and a 7 beat run of NSVT.  Put on metoprolol for control as was symptomatic with palpitations and Caran Storck follow up in clinic.    Yoshiye Kraft M. Dailyn Kempner MD 03/23/2015 7:01 AM

## 2015-03-24 ENCOUNTER — Encounter (HOSPITAL_COMMUNITY): Payer: Self-pay | Admitting: Internal Medicine

## 2015-03-24 LAB — TYPE AND SCREEN
ABO/RH(D): B POS
Antibody Screen: NEGATIVE
UNIT DIVISION: 0
Unit division: 0

## 2015-03-25 NOTE — Anesthesia Postprocedure Evaluation (Signed)
Anesthesia Post Note  Patient: Counselling psychologist  Procedure(s) Performed: Procedure(s) (LRB): ICD LEAD REMOVAL (Left)  Patient location during evaluation: PACU Anesthesia Type: General Level of consciousness: awake Pain management: pain level controlled Vital Signs Assessment: post-procedure vital signs reviewed and stable Respiratory status: spontaneous breathing and respiratory function stable Cardiovascular status: stable Postop Assessment: no signs of nausea or vomiting Anesthetic complications: no    Last Vitals:  Filed Vitals:   03/21/15 2330 03/22/15 0438  BP: 130/70 119/72  Pulse: 59 68  Temp:  36.4 C  Resp:  17    Last Pain:  Filed Vitals:   03/22/15 0833  PainSc: 0-No pain                 Jericka Kadar

## 2015-03-31 ENCOUNTER — Ambulatory Visit (INDEPENDENT_AMBULATORY_CARE_PROVIDER_SITE_OTHER): Payer: Managed Care, Other (non HMO) | Admitting: *Deleted

## 2015-03-31 DIAGNOSIS — Z4502 Encounter for adjustment and management of automatic implantable cardiac defibrillator: Secondary | ICD-10-CM

## 2015-03-31 NOTE — Progress Notes (Addendum)
Wound check appointment s/p ICD extraction. Steri-strips removed. Wound without redness or edema. Incision edges approximated, wound well healed. Stitch removed from left lateral incision, patient aware to monitor site for healing progress and to call if he develops signs/symptoms of infection or non-healing. ROV with Dr. Ladona Ridgel in 3 months.  Patient reported he is not currently taking his metoprolol.  He states that he had no negative effects from it, but does not take medications he does not feel like he needs.  Patient educated about metoprolol and advised to resume taking it at prescribed.  He verbalizes understanding of instructions.

## 2015-06-20 ENCOUNTER — Encounter: Payer: Managed Care, Other (non HMO) | Admitting: Internal Medicine

## 2015-08-15 ENCOUNTER — Other Ambulatory Visit: Payer: Managed Care, Other (non HMO)

## 2016-08-24 IMAGING — CR DG CHEST 2V
2 series · 2 of 2 positions shown · non-contrast
Comparison: 10/08/2014

CLINICAL DATA: Status post extraction of ICD.

EXAM:
CHEST - 2 VIEW

[chest pa]
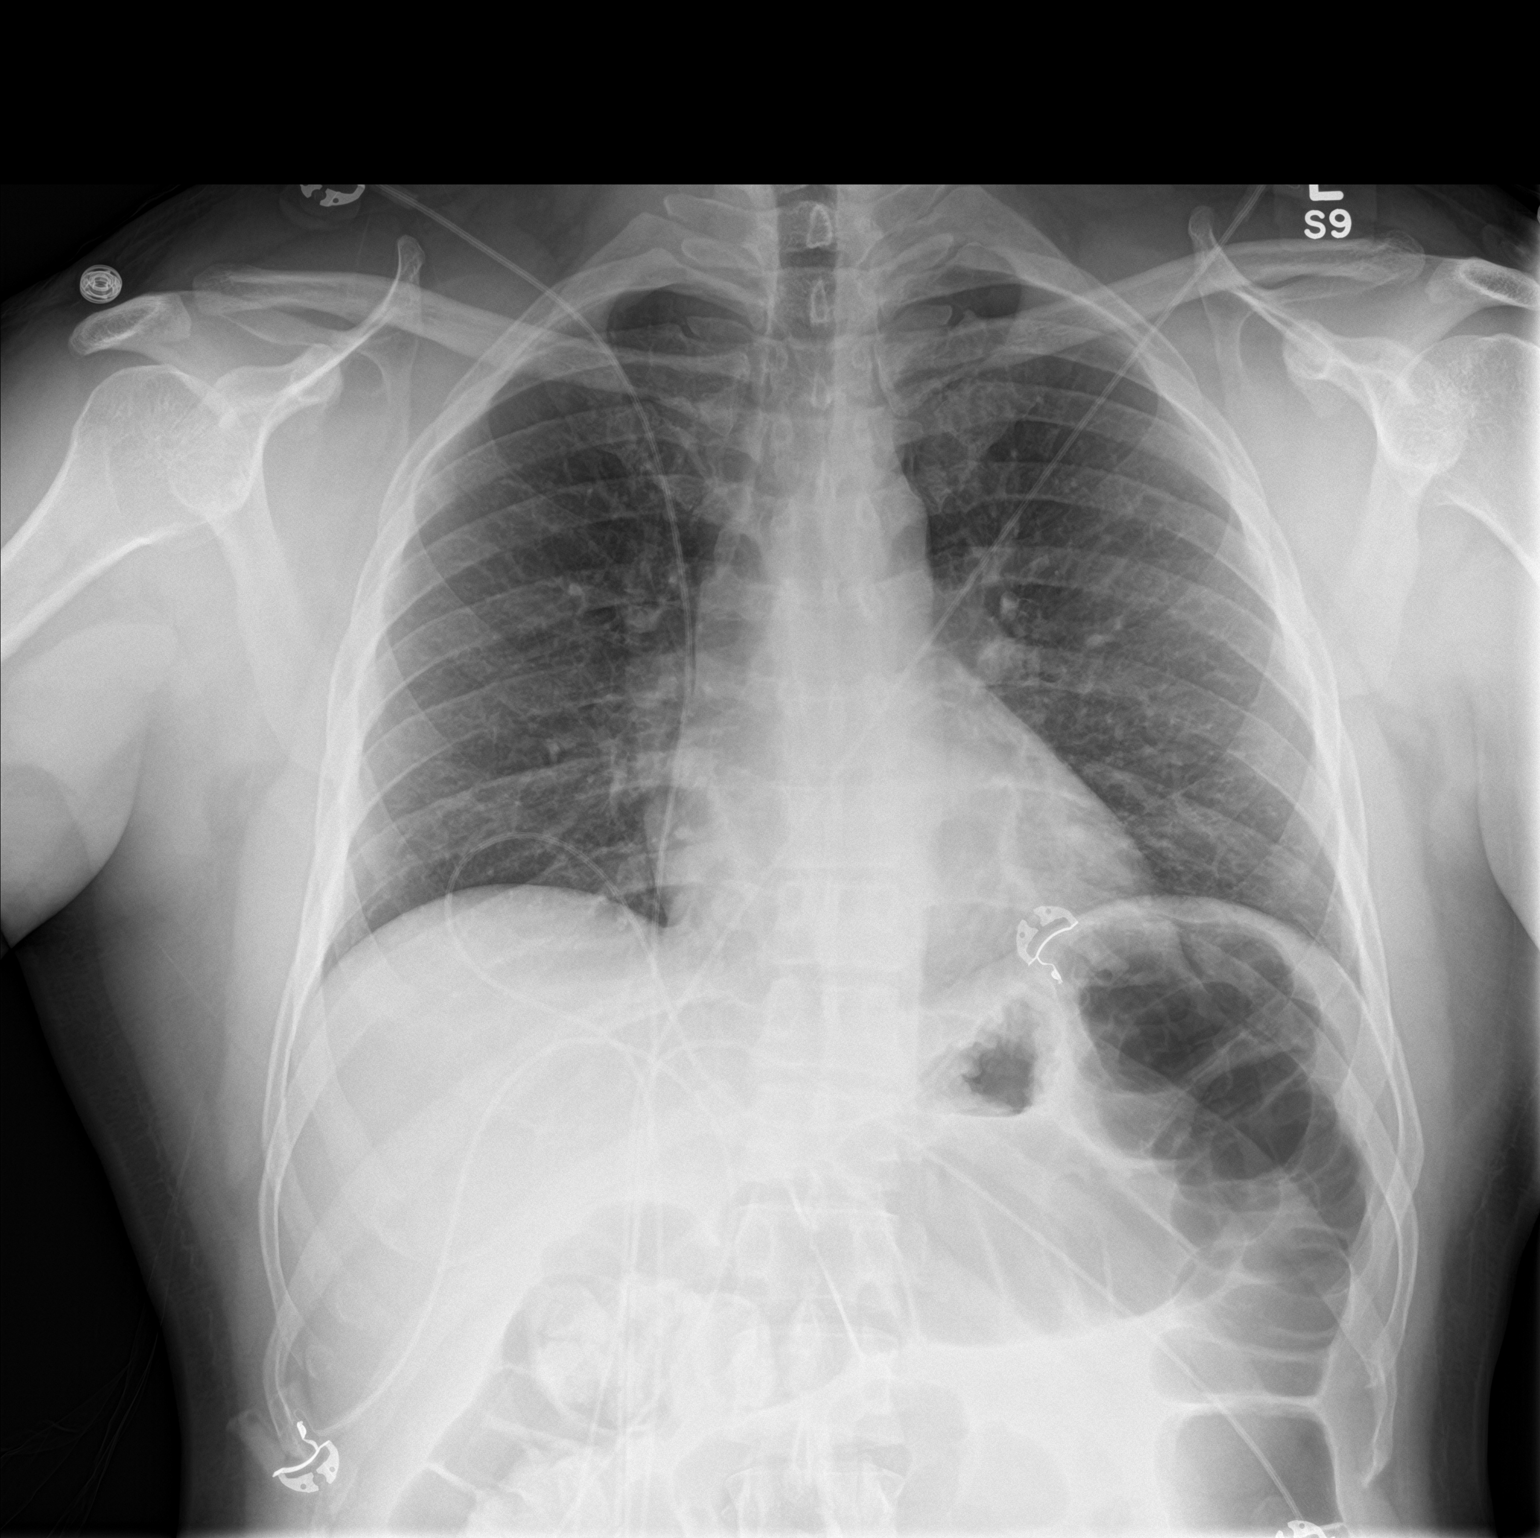

[chest lat]
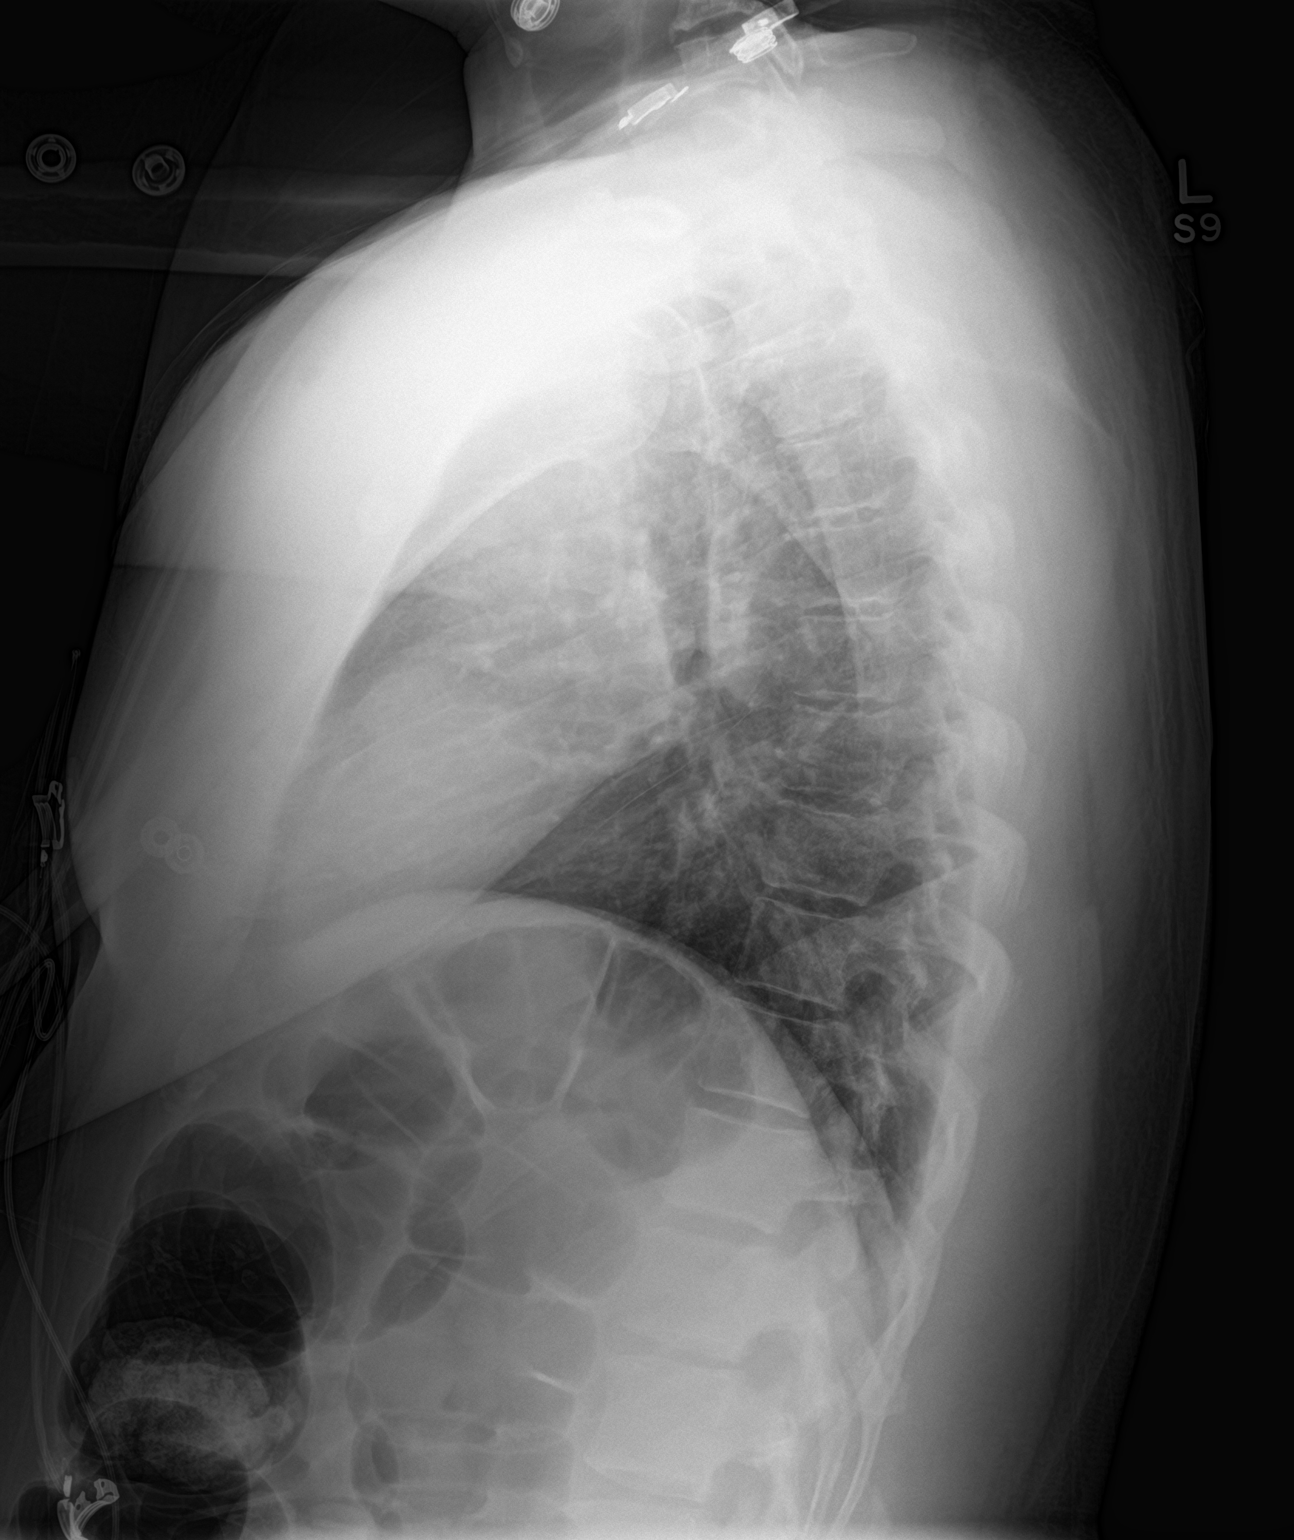

[2 of 2 positions shown; findings below may reference images not displayed]

FINDINGS: Previously visualized left sided single chamber defibrillator and
attached right ventricular lead have been completely removed. No
retained device fragments. No pneumothorax, edema, consolidation or
pleural fluid identified. The heart size and mediastinal contours
are within normal limits. No bony abnormalities.
IMPRESSION: Status post complete extraction of different later with no acute
findings.
